# Patient Record
Sex: Male | Born: 1935 | Race: White | Hispanic: No | Marital: Married | Smoking: Former smoker
Health system: Southern US, Community
[De-identification: ages and names within clinical notes are randomized; demographics above are authoritative.]

## PROBLEM LIST (undated history)

## (undated) DIAGNOSIS — E785 Hyperlipidemia, unspecified: Secondary | ICD-10-CM

## (undated) DIAGNOSIS — N400 Enlarged prostate without lower urinary tract symptoms: Secondary | ICD-10-CM

## (undated) DIAGNOSIS — I10 Essential (primary) hypertension: Secondary | ICD-10-CM

## (undated) DIAGNOSIS — K219 Gastro-esophageal reflux disease without esophagitis: Secondary | ICD-10-CM

## (undated) DIAGNOSIS — N390 Urinary tract infection, site not specified: Secondary | ICD-10-CM

## (undated) DIAGNOSIS — R251 Tremor, unspecified: Secondary | ICD-10-CM

## (undated) DIAGNOSIS — I219 Acute myocardial infarction, unspecified: Secondary | ICD-10-CM

## (undated) DIAGNOSIS — J9600 Acute respiratory failure, unspecified whether with hypoxia or hypercapnia: Secondary | ICD-10-CM

## (undated) DIAGNOSIS — T7840XA Allergy, unspecified, initial encounter: Secondary | ICD-10-CM

## (undated) DIAGNOSIS — I2510 Atherosclerotic heart disease of native coronary artery without angina pectoris: Secondary | ICD-10-CM

## (undated) DIAGNOSIS — C4490 Unspecified malignant neoplasm of skin, unspecified: Secondary | ICD-10-CM

## (undated) HISTORY — DX: Essential (primary) hypertension: I10

## (undated) HISTORY — DX: Atherosclerotic heart disease of native coronary artery without angina pectoris: I25.10

## (undated) HISTORY — DX: Urinary tract infection, site not specified: N39.0

## (undated) HISTORY — DX: Hyperlipidemia, unspecified: E78.5

## (undated) HISTORY — DX: Unspecified malignant neoplasm of skin, unspecified: C44.90

## (undated) HISTORY — DX: Gastro-esophageal reflux disease without esophagitis: K21.9

## (undated) HISTORY — DX: Benign prostatic hyperplasia without lower urinary tract symptoms: N40.0

## (undated) HISTORY — PX: CATARACT EXTRACTION: SUR2

## (undated) HISTORY — DX: Acute myocardial infarction, unspecified: I21.9

## (undated) HISTORY — PX: TONSILLECTOMY: SUR1361

## (undated) HISTORY — DX: Tremor, unspecified: R25.1

## (undated) HISTORY — DX: Acute respiratory failure, unspecified whether with hypoxia or hypercapnia: J96.00

## (undated) HISTORY — DX: Allergy, unspecified, initial encounter: T78.40XA

---

## 2002-05-10 DIAGNOSIS — I219 Acute myocardial infarction, unspecified: Secondary | ICD-10-CM

## 2002-05-10 HISTORY — DX: Acute myocardial infarction, unspecified: I21.9

## 2003-04-10 HISTORY — PX: CARDIAC CATHETERIZATION: SHX172

## 2003-05-10 ENCOUNTER — Inpatient Hospital Stay (HOSPITAL_COMMUNITY): Admission: EM | Admit: 2003-05-10 | Discharge: 2003-05-28 | Payer: Self-pay | Admitting: Emergency Medicine

## 2003-05-23 ENCOUNTER — Encounter: Payer: Self-pay | Admitting: Cardiology

## 2003-06-24 ENCOUNTER — Encounter (HOSPITAL_COMMUNITY): Admission: RE | Admit: 2003-06-24 | Discharge: 2003-09-22 | Payer: Self-pay | Admitting: Cardiology

## 2004-03-19 ENCOUNTER — Ambulatory Visit: Payer: Self-pay | Admitting: Family Medicine

## 2004-05-14 ENCOUNTER — Ambulatory Visit: Payer: Self-pay | Admitting: Cardiology

## 2004-05-20 ENCOUNTER — Ambulatory Visit: Payer: Self-pay | Admitting: *Deleted

## 2004-11-24 ENCOUNTER — Ambulatory Visit: Payer: Self-pay | Admitting: Cardiology

## 2004-12-02 ENCOUNTER — Ambulatory Visit: Payer: Self-pay

## 2004-12-02 ENCOUNTER — Ambulatory Visit: Payer: Self-pay | Admitting: Cardiology

## 2004-12-17 ENCOUNTER — Ambulatory Visit: Payer: Self-pay | Admitting: Family Medicine

## 2005-01-12 ENCOUNTER — Ambulatory Visit: Payer: Self-pay | Admitting: Gastroenterology

## 2005-01-19 ENCOUNTER — Ambulatory Visit: Payer: Self-pay | Admitting: Family Medicine

## 2005-02-01 ENCOUNTER — Encounter (INDEPENDENT_AMBULATORY_CARE_PROVIDER_SITE_OTHER): Payer: Self-pay | Admitting: *Deleted

## 2005-02-01 ENCOUNTER — Ambulatory Visit: Payer: Self-pay | Admitting: Gastroenterology

## 2005-02-11 ENCOUNTER — Ambulatory Visit: Payer: Self-pay | Admitting: Gastroenterology

## 2005-02-17 ENCOUNTER — Ambulatory Visit: Payer: Self-pay | Admitting: Cardiology

## 2005-03-08 ENCOUNTER — Ambulatory Visit: Payer: Self-pay

## 2005-03-19 ENCOUNTER — Ambulatory Visit: Payer: Self-pay | Admitting: Cardiology

## 2005-03-24 ENCOUNTER — Ambulatory Visit: Payer: Self-pay | Admitting: Family Medicine

## 2005-07-06 ENCOUNTER — Ambulatory Visit: Payer: Self-pay | Admitting: Gastroenterology

## 2005-08-03 ENCOUNTER — Ambulatory Visit: Payer: Self-pay | Admitting: Gastroenterology

## 2005-08-03 ENCOUNTER — Ambulatory Visit (HOSPITAL_COMMUNITY): Admission: RE | Admit: 2005-08-03 | Discharge: 2005-08-03 | Payer: Self-pay | Admitting: Gastroenterology

## 2005-08-25 ENCOUNTER — Ambulatory Visit: Payer: Self-pay | Admitting: Cardiology

## 2005-08-27 ENCOUNTER — Ambulatory Visit: Payer: Self-pay | Admitting: Cardiology

## 2006-02-08 ENCOUNTER — Ambulatory Visit: Payer: Self-pay | Admitting: Cardiology

## 2006-03-17 ENCOUNTER — Ambulatory Visit: Payer: Self-pay | Admitting: Family Medicine

## 2006-04-20 ENCOUNTER — Ambulatory Visit: Payer: Self-pay | Admitting: Family Medicine

## 2006-04-20 LAB — CONVERTED CEMR LAB
ALT: 22 units/L (ref 0–40)
AST: 32 units/L (ref 0–37)
Alkaline Phosphatase: 75 units/L (ref 39–117)
BUN: 22 mg/dL (ref 6–23)
Chol/HDL Ratio, serum: 2.2
Cholesterol: 107 mg/dL (ref 0–200)
Creatinine, Ser: 1.5 mg/dL (ref 0.4–1.5)
Glucose, Bld: 97 mg/dL (ref 70–99)
HDL: 49.6 mg/dL (ref >39.0)
Hgb A1c MFr Bld: 6.2 % — ABNORMAL HIGH (ref 4.6–6.0)
LDL Cholesterol: 47 mg/dL (ref 0–99)
PSA: 0.65 ng/mL (ref 0.10–4.00)
Potassium: 4.9 meq/L (ref 3.5–5.1)
Sodium: 148 meq/L — ABNORMAL HIGH (ref 135–145)
TSH: 5.15 microintl units/mL (ref 0.35–5.50)
Triglyceride fasting, serum: 52 mg/dL (ref 0–149)
VLDL: 10 mg/dL (ref 0–40)

## 2006-04-21 ENCOUNTER — Ambulatory Visit: Payer: Self-pay | Admitting: Family Medicine

## 2006-08-24 ENCOUNTER — Ambulatory Visit: Payer: Self-pay | Admitting: Cardiology

## 2006-08-31 ENCOUNTER — Ambulatory Visit: Payer: Self-pay | Admitting: Cardiology

## 2006-08-31 LAB — CONVERTED CEMR LAB
ALT: 22 units/L (ref 0–40)
AST: 24 units/L (ref 0–37)
Albumin: 3.7 g/dL (ref 3.5–5.2)
Alkaline Phosphatase: 69 units/L (ref 39–117)
BUN: 23 mg/dL (ref 6–23)
Bilirubin, Direct: 0.1 mg/dL (ref 0.0–0.3)
CO2: 27 meq/L (ref 19–32)
Calcium: 9.1 mg/dL (ref 8.4–10.5)
Chloride: 110 meq/L (ref 96–112)
Cholesterol: 107 mg/dL (ref 0–200)
Creatinine, Ser: 1.3 mg/dL (ref 0.4–1.5)
GFR calc Af Amer: 70 mL/min
GFR calc non Af Amer: 58 mL/min
Glucose, Bld: 81 mg/dL (ref 70–99)
HDL: 47 mg/dL (ref >39.0)
LDL Cholesterol: 47 mg/dL (ref 0–99)
Potassium: 4.1 meq/L (ref 3.5–5.1)
Sodium: 143 meq/L (ref 135–145)
Total Bilirubin: 0.8 mg/dL (ref 0.3–1.2)
Total CHOL/HDL Ratio: 2.3
Total Protein: 6.6 g/dL (ref 6.0–8.3)
Triglycerides: 64 mg/dL (ref 0–149)
VLDL: 13 mg/dL (ref 0–40)

## 2006-10-26 ENCOUNTER — Ambulatory Visit: Payer: Self-pay | Admitting: Family Medicine

## 2007-02-17 ENCOUNTER — Ambulatory Visit: Payer: Self-pay | Admitting: Family Medicine

## 2007-05-17 ENCOUNTER — Ambulatory Visit: Payer: Self-pay | Admitting: Cardiology

## 2007-05-19 ENCOUNTER — Ambulatory Visit: Payer: Self-pay

## 2007-05-19 LAB — CONVERTED CEMR LAB
ALT: 18 units/L (ref 0–53)
AST: 21 units/L (ref 0–37)
Albumin: 3.7 g/dL (ref 3.5–5.2)
Alkaline Phosphatase: 69 units/L (ref 39–117)
BUN: 24 mg/dL — ABNORMAL HIGH (ref 6–23)
Bilirubin, Direct: 0.1 mg/dL (ref 0.0–0.3)
CO2: 25 meq/L (ref 19–32)
Calcium: 9.2 mg/dL (ref 8.4–10.5)
Chloride: 107 meq/L (ref 96–112)
Cholesterol: 107 mg/dL (ref 0–200)
Creatinine, Ser: 1.4 mg/dL (ref 0.4–1.5)
GFR calc Af Amer: 64 mL/min
GFR calc non Af Amer: 53 mL/min
Glucose, Bld: 95 mg/dL (ref 70–99)
HDL: 42.7 mg/dL (ref >39.0)
LDL Cholesterol: 51 mg/dL (ref 0–99)
Potassium: 3.6 meq/L (ref 3.5–5.1)
Sodium: 139 meq/L (ref 135–145)
Total Bilirubin: 1 mg/dL (ref 0.3–1.2)
Total CHOL/HDL Ratio: 2.5
Total Protein: 6.6 g/dL (ref 6.0–8.3)
Triglycerides: 67 mg/dL (ref 0–149)
VLDL: 13 mg/dL (ref 0–40)

## 2008-02-13 ENCOUNTER — Ambulatory Visit: Payer: Self-pay | Admitting: Family Medicine

## 2008-04-16 ENCOUNTER — Ambulatory Visit: Payer: Self-pay | Admitting: Family Medicine

## 2008-04-16 DIAGNOSIS — I2510 Atherosclerotic heart disease of native coronary artery without angina pectoris: Secondary | ICD-10-CM | POA: Insufficient documentation

## 2008-04-16 DIAGNOSIS — K219 Gastro-esophageal reflux disease without esophagitis: Secondary | ICD-10-CM

## 2008-04-16 DIAGNOSIS — I10 Essential (primary) hypertension: Secondary | ICD-10-CM

## 2008-04-16 DIAGNOSIS — E785 Hyperlipidemia, unspecified: Secondary | ICD-10-CM

## 2008-05-23 ENCOUNTER — Ambulatory Visit: Payer: Self-pay | Admitting: Cardiology

## 2008-05-23 LAB — CONVERTED CEMR LAB
BUN: 24 mg/dL — ABNORMAL HIGH (ref 6–23)
CO2: 26 meq/L (ref 19–32)
GFR calc Af Amer: 69 mL/min
Glucose, Bld: 94 mg/dL (ref 70–99)
Potassium: 3.7 meq/L (ref 3.5–5.1)
Sodium: 140 meq/L (ref 135–145)

## 2008-07-23 ENCOUNTER — Telehealth: Payer: Self-pay | Admitting: Family Medicine

## 2009-02-13 ENCOUNTER — Ambulatory Visit: Payer: Self-pay | Admitting: Family Medicine

## 2009-05-16 ENCOUNTER — Ambulatory Visit: Payer: Self-pay | Admitting: Family Medicine

## 2009-05-16 DIAGNOSIS — IMO0002 Reserved for concepts with insufficient information to code with codable children: Secondary | ICD-10-CM | POA: Insufficient documentation

## 2009-05-19 ENCOUNTER — Ambulatory Visit: Payer: Self-pay | Admitting: Family Medicine

## 2009-05-19 LAB — CONVERTED CEMR LAB
ALT: 16 units/L (ref 0–53)
AST: 23 units/L (ref 0–37)
Albumin: 3.9 g/dL (ref 3.5–5.2)
Alkaline Phosphatase: 71 units/L (ref 39–117)
Basophils Relative: 0.7 % (ref 0.0–3.0)
Bilirubin, Direct: 0.1 mg/dL (ref 0.0–0.3)
CO2: 24 meq/L (ref 19–32)
Calcium: 9.6 mg/dL (ref 8.4–10.5)
Creatinine, Ser: 1.4 mg/dL (ref 0.4–1.5)
Eosinophils Absolute: 0.1 10*3/uL (ref 0.0–0.7)
Eosinophils Relative: 1.4 % (ref 0.0–5.0)
GFR calc non Af Amer: 52.49 mL/min (ref >60)
HDL: 49.8 mg/dL (ref >39.00)
Hemoglobin: 13.7 g/dL (ref 13.0–17.0)
LDL Cholesterol: 53 mg/dL (ref 0–99)
Lymphocytes Relative: 24.1 % (ref 12.0–46.0)
Monocytes Relative: 8.5 % (ref 3.0–12.0)
Neutro Abs: 4.5 10*3/uL (ref 1.4–7.7)
Neutrophils Relative %: 65.3 % (ref 43.0–77.0)
RBC: 4.8 M/uL (ref 4.22–5.81)
Sodium: 142 meq/L (ref 135–145)
Total CHOL/HDL Ratio: 2
Total Protein: 7.6 g/dL (ref 6.0–8.3)
Triglycerides: 64 mg/dL (ref 0.0–149.0)
WBC: 6.8 10*3/uL (ref 4.5–10.5)

## 2009-05-21 ENCOUNTER — Ambulatory Visit: Payer: Self-pay | Admitting: Family Medicine

## 2009-05-26 ENCOUNTER — Ambulatory Visit: Payer: Self-pay | Admitting: Family Medicine

## 2009-06-05 ENCOUNTER — Ambulatory Visit: Payer: Self-pay | Admitting: Family Medicine

## 2009-06-19 DIAGNOSIS — J9600 Acute respiratory failure, unspecified whether with hypoxia or hypercapnia: Secondary | ICD-10-CM

## 2009-06-23 ENCOUNTER — Ambulatory Visit: Payer: Self-pay | Admitting: Cardiology

## 2009-08-18 ENCOUNTER — Telehealth: Payer: Self-pay | Admitting: Family Medicine

## 2009-10-09 ENCOUNTER — Emergency Department (HOSPITAL_COMMUNITY): Admission: EM | Admit: 2009-10-09 | Discharge: 2009-10-09 | Payer: Self-pay | Admitting: Emergency Medicine

## 2009-10-10 ENCOUNTER — Emergency Department (HOSPITAL_COMMUNITY)
Admission: EM | Admit: 2009-10-10 | Discharge: 2009-10-10 | Payer: Self-pay | Source: Home / Self Care | Admitting: Emergency Medicine

## 2009-11-07 ENCOUNTER — Ambulatory Visit: Payer: Self-pay | Admitting: Internal Medicine

## 2009-11-07 DIAGNOSIS — N390 Urinary tract infection, site not specified: Secondary | ICD-10-CM | POA: Insufficient documentation

## 2010-02-12 ENCOUNTER — Ambulatory Visit: Payer: Self-pay | Admitting: Family Medicine

## 2010-06-07 LAB — CONVERTED CEMR LAB
ALT: 19 units/L (ref 0–53)
AST: 25 units/L (ref 0–37)
Albumin: 4.1 g/dL (ref 3.5–5.2)
Alkaline Phosphatase: 72 units/L (ref 39–117)
Basophils Absolute: 0 10*3/uL (ref 0.0–0.1)
Basophils Relative: 0.3 % (ref 0.0–3.0)
Hemoglobin: 14.8 g/dL (ref 13.0–17.0)
Hgb A1c MFr Bld: 6.3 % — ABNORMAL HIGH (ref 4.6–6.0)
LDL Cholesterol: 68 mg/dL (ref 0–99)
Lymphocytes Relative: 25.8 % (ref 12.0–46.0)
Monocytes Relative: 7.3 % (ref 3.0–12.0)
Neutro Abs: 4.7 10*3/uL (ref 1.4–7.7)
Neutrophils Relative %: 64.7 % (ref 43.0–77.0)
RBC: 5.02 M/uL (ref 4.22–5.81)
Total CHOL/HDL Ratio: 2.6
VLDL: 15 mg/dL (ref 0–40)
WBC: 7.2 10*3/uL (ref 4.5–10.5)

## 2010-06-09 NOTE — Assessment & Plan Note (Signed)
Summary: ROV/MM/wife rescd per pt//ccm   Reason for Visit follow up with scalp  History of Present Illness: Mr. Heist comes in today for evaluation of cellulitis of the scalp.  It is markedly improved.  His healing.  No pus.  Allergies: 1)  ! Sulfa  Past History:  Past medical, surgical, family and social histories (including risk factors) reviewed for relevance to current acute and chronic problems.  Past Medical History: Reviewed history from 04/16/2008 and no changes required. Coronary artery disease Hyperlipidemia Hypertension GERD  Past Surgical History: Reviewed history from 04/16/2008 and no changes required. cardiac catheter December 2004 stents Tonsillectomy  Family History: Reviewed history from 04/16/2008 and no changes required. Family History of CAD Male 1st degree relative <60 Family History Hypertension  Social History: Reviewed history from 04/16/2008 and no changes required. Retired Married Former Smoker Alcohol use-no Drug use-no Regular exercise-yes  Review of Systems      See HPI  Physical Exam  General:  Well-developed,well-nourished,in no acute distress; alert,appropriate and cooperative throughout examination Skin:  the scalp is healing well.  There is still some erythema   Impression & Recommendations:  Problem # 1:  ABRASION (ICD-919.0) Assessment Improved  Complete Medication List: 1)  Plavix 75 Mg Tabs (Clopidogrel bisulfate) .... Take one tab once daily 2)  Prilosec Otc 20 Mg Tbec (Omeprazole magnesium) .... Take once daily 3)  Coreg 12.5 Mg Tabs (Carvedilol) .... Take one tab two times a day 4)  Lisinopril 2.5 Mg Tabs (Lisinopril) .... Take one  tab two times a day 5)  Lipitor 80 Mg Tabs (Atorvastatin calcium) .... Take one tab once daily 6)  Adult Aspirin Ec Low Strength 81 Mg Tbec (Aspirin) .... Once daily 7)  Nitrostat 0.4 Mg Subl (Nitroglycerin) .... As needed 8)  Colace 50 Mg Caps (Docusate sodium) .... As  needed 9)  Keflex 500 Mg Caps (Cephalexin) .... 2 by mouth two times a day 10)  Doxycycline Hyclate 100 Mg Caps (Doxycycline hyclate) .... Take 1 tablet by mouth two times a day 11)  Silvadene 1 % Crea (Silver sulfadiazine) .... Apply daily  Other Orders: Prescription Created Electronically [phone])  Patient Instructions: 1)  leave the area I near her scalp, open to the air. 2)  Take one Keflex, and one doxycycline daily, x 3 weeks.  Return p.r.n. Prescriptions: DOXYCYCLINE HYCLATE 100 MG CAPS (DOXYCYCLINE HYCLATE) Take 1 tablet by mouth two times a day  #21 x 1   Entered and Authorized by:   Roderick Pee MD   Signed by:   Roderick Pee MD on 06/05/2009   Method used:   Electronically to        [id number] (retail)       [address]       [place], [place]  [address]       Ph: [phone] or [phone]       Fax: [phone]   RxID:   [id number] KEFLEX 500 MG CAPS (CEPHALEXIN) 2 by mouth two times a day  #21 x 1   Entered and Authorized by:   Roderick Pee MD   Signed by:   Roderick Pee MD on 06/05/2009   Method used:   Electronically to        [id number] (retail)       [address]       [place], [place]  [address]       Ph: [phone] or [phone]  Fax: [phone]   RxID:   [id number]

## 2010-06-09 NOTE — Assessment & Plan Note (Signed)
Summary: 5 day rov/njr   History of Present Illness: Gram is a 77 year old male, who comes in today for reevaluation of cellulitis of the scalp.  He had a 6-inch area of erythema with secondary infection.  Is now about 3 inches in diameter.  Its healing, but not completely well.  He continues to take Keflex and doxycycline.  He's allergic to Septra.  In the past.  He had a slightly elevated blood sugar when he was hospitalized on IV fluids.  Will check blood sugar today.    Allergies: 1)  ! Sulfa  Past History:  Past medical, surgical, family and social histories (including risk factors) reviewed for relevance to current acute and chronic problems.  Past Medical History: Reviewed history from 04/16/2008 and no changes required. Coronary artery disease Hyperlipidemia Hypertension GERD  Past Surgical History: Reviewed history from 04/16/2008 and no changes required. cardiac catheter December 2004 stents Tonsillectomy  Family History: Reviewed history from 04/16/2008 and no changes required. Family History of CAD Male 1st degree relative <60 Family History Hypertension  Social History: Reviewed history from 04/16/2008 and no changes required. Retired Married Former Smoker Alcohol use-no Drug use-no Regular exercise-yes  Review of Systems      See HPI  Physical Exam  General:  Well-developed,well-nourished,in no acute distress; alert,appropriate and cooperative throughout examination Skin:  the area if erythema is about 50% smaller than it was a week ago.   Impression & Recommendations:  Problem # 1:  ABRASION (ICD-919.0) Assessment Improved  Orders: Glucose, (CBG) ([address])  Complete Medication List: 1)  Plavix 75 Mg Tabs (Clopidogrel bisulfate) .... Take one tab once daily 2)  Prilosec Otc 20 Mg Tbec (Omeprazole magnesium) .... Take once daily 3)  Coreg 12.5 Mg Tabs (Carvedilol) .... Take one tab two times a day 4)  Lisinopril 2.5 Mg Tabs (Lisinopril)  .... Take one  tab two times a day 5)  Lipitor 80 Mg Tabs (Atorvastatin calcium) .... Take one tab once daily 6)  Adult Aspirin Ec Low Strength 81 Mg Tbec (Aspirin) .... Once daily 7)  Nitrostat 0.4 Mg Subl (Nitroglycerin) .... As needed 8)  Colace 50 Mg Caps (Docusate sodium) .... As needed 9)  Keflex 500 Mg Caps (Cephalexin) .... 2 by mouth two times a day 10)  Doxycycline Hyclate 100 Mg Caps (Doxycycline hyclate) .... Take 1 tablet by mouth two times a day 11)  Silvadene 1 % Crea (Silver sulfadiazine) .... Apply daily  Patient Instructions: 1)  continue current treatment program return in 10 days for follow

## 2010-06-09 NOTE — Assessment & Plan Note (Signed)
Summary: f1y/jss    CC:  yearly visit.  History of Present Illness: [name] [name] is a pleasant gentleman who has a history of coronary artery disease, ischemic cardiomyopathy, and hyperlipidemia.  He had a myocardial infarction in December 2004 complicated by ventricular fibrillation arrest.  He had PCI of his LAD at that time.  His most recent Myoview on May 19, 2007, showed an ejection fraction of 24%. There was a prior anterior, septal, and apical infarct, but there was no ischemia.  He did have 4-beat run of ventricular tachycardia.  He has declined ICD previously. Previous carotid Dopplers in July of 2006 showed 0-39% bilateral stenosis. Abdominal ultrasound at that time showed no aneurysm. I last saw him in January of 2010. Since then the patient denies any dyspnea on exertion, orthopnea, PND, pedal edema, palpitations, syncope or chest pain.   Current Medications (verified): 1)  Plavix 75 Mg Tabs (Clopidogrel Bisulfate) .... Take One Tab Once Daily 2)  Prilosec Otc 20 Mg Tbec (Omeprazole Magnesium) .... Take Once Daily 3)  Coreg 12.5 Mg Tabs (Carvedilol) .... Take One Tab Two Times A Day 4)  Lisinopril 2.5 Mg Tabs (Lisinopril) .... Take One  Tab Two Times A Day 5)  Lipitor 80 Mg Tabs (Atorvastatin Calcium) .... Take One Tab Once Daily 6)  Adult Aspirin Ec Low Strength 81 Mg Tbec (Aspirin) .... Once Daily 7)  Nitrostat 0.4 Mg Subl (Nitroglycerin) .... As Needed 8)  Keflex 500 Mg Caps (Cephalexin) .... 2 By Mouth Two Times A Day 9)  Doxycycline Hyclate 100 Mg Caps (Doxycycline Hyclate) .... Take 1 Tablet By Mouth Two Times A Day  Allergies: 1)  ! Sulfa  Past History:  Past Medical History: Current Problems:  HYPERTENSION (ICD-401.9) HYPERLIPIDEMIA (ICD-272.4) CORONARY ARTERY DISEASE (ICD-414.00) CARDIOMYOPATHY (ICD-425.4) GERD (ICD-530.81)  ventricular fibrillation arrest in the emergency  room.  Past Surgical History: Reviewed history from 04/16/2008 and no changes  required. cardiac catheter December 2004 stents Tonsillectomy  Social History: Reviewed history from 04/16/2008 and no changes required. Retired Married Former Smoker Alcohol use-no Drug use-no Regular exercise-yes  Review of Systems       Recent cellulitis on the scalp but no fevers or chills, productive cough, hemoptysis, dysphasia, odynophagia, melena, hematochezia, dysuria, hematuria, rash, seizure activity, orthopnea, PND, pedal edema, claudication. Remaining systems are negative.   Vital Signs:  Patient profile:   77 year old male Height:      68 inches Weight:      154 pounds BMI:     23.50 Pulse rate:   76 / minute Pulse rhythm:   regular Resp:     12 per minute BP sitting:   130 / 82  (left arm)  Vitals Entered By: Kem Parkinson (June 23, 2009 9:10 AM)  Physical Exam  General:  Well-developed well-nourished in no acute distress.  Skin is warm and dry.  HEENT is significant for healing cellulitis on the scalp. Neck is supple. No thyromegaly.  Chest is clear to auscultation with normal expansion.  Cardiovascular exam is regular rate and rhythm.  Abdominal exam nontender or distended. No masses palpated. Extremities show no edema. neuro grossly intact    EKG  Procedure date:  06/23/2009  Findings:      Sinus rhythm at a rate of 76. Axis normal. Prior septal infarct. Nonspecific ST changes.  Impression & Recommendations:  Problem # 1:  CORONARY ARTERY DISEASE (ICD-414.00) Continue aspirin, Plavix, beta blocker, ACE inhibitor and statin. Continue diet and exercise. He does not  smoke. His updated medication list for this problem includes:    Plavix 75 Mg Tabs (Clopidogrel bisulfate) .Marland Kitchen... Take one tab once daily    Coreg 12.5 Mg Tabs (Carvedilol) .Marland Kitchen... Take one tab two times a day    Lisinopril 2.5 Mg Tabs (Lisinopril) .Marland Kitchen... Take one  tab two times a day    Adult Aspirin Ec Low Strength 81 Mg Tbec (Aspirin) ..... Once daily    Nitrostat 0.4 Mg  Subl (Nitroglycerin) .Marland Kitchen... As needed  Problem # 2:  CARDIOMYOPATHY (ICD-425.4) Continue present medications. He has declined ICD and understands the risk of sudden death. His updated medication list for this problem includes:    Plavix 75 Mg Tabs (Clopidogrel bisulfate) .Marland Kitchen... Take one tab once daily    Coreg 12.5 Mg Tabs (Carvedilol) .Marland Kitchen... Take one tab two times a day    Lisinopril 2.5 Mg Tabs (Lisinopril) .Marland Kitchen... Take one  tab two times a day    Adult Aspirin Ec Low Strength 81 Mg Tbec (Aspirin) ..... Once daily    Nitrostat 0.4 Mg Subl (Nitroglycerin) .Marland Kitchen... As needed  Problem # 3:  HYPERTENSION (ICD-401.9) Continue present medications. Blood pressure controlled. Recent potassium normal. His updated medication list for this problem includes:    Coreg 12.5 Mg Tabs (Carvedilol) .Marland Kitchen... Take one tab two times a day    Lisinopril 2.5 Mg Tabs (Lisinopril) .Marland Kitchen... Take one  tab two times a day    Adult Aspirin Ec Low Strength 81 Mg Tbec (Aspirin) ..... Once daily  Problem # 4:  HYPERLIPIDEMIA (ICD-272.4) Continue statin. Lipids and liver in January of 2011 outstanding. His updated medication list for this problem includes:    Lipitor 80 Mg Tabs (Atorvastatin calcium) .Marland Kitchen... Take one tab once daily  Problem # 5:  GERD (ICD-530.81) Discontinue Prilosec and treat with Pepcid or Zantac. His updated medication list for this problem includes:    Prilosec Otc 20 Mg Tbec (Omeprazole magnesium) .Marland Kitchen... Take once daily  Patient Instructions: 1)  Your physician recommends that you schedule a follow-up appointment in:ONE YEAR

## 2010-06-09 NOTE — Assessment & Plan Note (Signed)
Summary: UTI? // RS   Vital Signs:  Patient profile:   77 year old male Weight:      154 pounds Temp:     97.7 degrees F oral BP sitting:   140 / 80 Cuff size:   regular  Vitals Entered By: Kathrynn Speed CMA (November 07, 2009 2:32 PM) CC: UTI / buring & pain for one month / src   CC:  UTI / buring & pain for one month / src.  History of Present Illness: 77 year old patient who is seen today with a two week history of burning, dysuria, and frequency;  for the past several days, he has noted cloudy urine.  there is been no fever or chills.  He has been quite frustrated due to a scalp dermatitis, apparently secondary to Pseudomonas.  He has treated hypertension and coronary artery disease, which have been stable  Current Medications (verified): 1)  Plavix 75 Mg Tabs (Clopidogrel Bisulfate) .... Take One Tab Once Daily 2)  Coreg 12.5 Mg Tabs (Carvedilol) .... Take One Tab Two Times A Day 3)  Lisinopril 2.5 Mg Tabs (Lisinopril) .... Take One  Tab Two Times A Day 4)  Lipitor 80 Mg Tabs (Atorvastatin Calcium) .... Take One Tab Once Daily 5)  Adult Aspirin Ec Low Strength 81 Mg Tbec (Aspirin) .... Once Daily 6)  Nitrostat 0.4 Mg Subl (Nitroglycerin) .... As Needed 7)  Keflex 500 Mg Caps (Cephalexin) .... 2 By Mouth Two Times A Day 8)  Doxycycline Hyclate 100 Mg Caps (Doxycycline Hyclate) .... Take 1 Tablet By Mouth Two Times A Day 9)  Zantac 25 Mg/ml Soln (Ranitidine Hcl) .... Once A Day  Allergies (verified): 1)  ! Sulfa  Past History:  Past Medical History: Reviewed history from 06/23/2009 and no changes required. Current Problems:  HYPERTENSION (ICD-401.9) HYPERLIPIDEMIA (ICD-272.4) CORONARY ARTERY DISEASE (ICD-414.00) CARDIOMYOPATHY (ICD-425.4) GERD (ICD-530.81)  ventricular fibrillation arrest in the emergency  room.  Review of Systems       The patient complains of suspicious skin lesions.  The patient denies anorexia, fever, weight loss, weight gain, vision loss,  decreased hearing, hoarseness, chest pain, syncope, dyspnea on exertion, peripheral edema, prolonged cough, headaches, hemoptysis, abdominal pain, melena, hematochezia, severe indigestion/heartburn, hematuria, incontinence, genital sores, muscle weakness, transient blindness, difficulty walking, depression, unusual weight change, abnormal bleeding, enlarged lymph nodes, angioedema, breast masses, and testicular masses.    Physical Exam  General:  Well-developed,well-nourished,in no acute distress; alert,appropriate and cooperative throughout examination Neck:  No deformities, masses, or tenderness noted. Lungs:  Normal respiratory effort, chest expands symmetrically. Lungs are clear to auscultation, no crackles or wheezes. Heart:  Normal rate and regular rhythm. S1 and S2 normal without gallop, murmur, click, rub or other extra sounds.   Impression & Recommendations:  Problem # 1:  UTI (ICD-599.0)  His updated medication list for this problem includes:    Keflex 500 Mg Caps (Cephalexin) .Marland Kitchen... 2 by mouth two times a day    Doxycycline Hyclate 100 Mg Caps (Doxycycline hyclate) .Marland Kitchen... Take 1 tablet by mouth two times a day    Ciprofloxacin Hcl 500 Mg Tabs (Ciprofloxacin hcl) ..... One twice daily  Orders: UA Dipstick w/o Micro (manual) ([address])  Problem # 2:  HYPERTENSION (ICD-401.9)  His updated medication list for this problem includes:    Coreg 12.5 Mg Tabs (Carvedilol) .Marland Kitchen... Take one tab two times a day    Lisinopril 2.5 Mg Tabs (Lisinopril) .Marland Kitchen... Take one  tab two times a day  Complete Medication  List: 1)  Plavix 75 Mg Tabs (Clopidogrel bisulfate) .... Take one tab once daily 2)  Coreg 12.5 Mg Tabs (Carvedilol) .... Take one tab two times a day 3)  Lisinopril 2.5 Mg Tabs (Lisinopril) .... Take one  tab two times a day 4)  Lipitor 80 Mg Tabs (Atorvastatin calcium) .... Take one tab once daily 5)  Adult Aspirin Ec Low Strength 81 Mg Tbec (Aspirin) .... Once daily 6)  Nitrostat 0.4 Mg  Subl (Nitroglycerin) .... As needed 7)  Keflex 500 Mg Caps (Cephalexin) .... 2 by mouth two times a day 8)  Doxycycline Hyclate 100 Mg Caps (Doxycycline hyclate) .... Take 1 tablet by mouth two times a day 9)  Zantac 25 Mg/ml Soln (Ranitidine hcl) .... Once a day 10)  Ciprofloxacin Hcl 500 Mg Tabs (Ciprofloxacin hcl) .... One twice daily  Patient Instructions: 1)  Take your antibiotic as prescribed until ALL of it is gone, but stop if you develop a rash or swelling and contact our office as soon as possible. Prescriptions: CIPROFLOXACIN HCL 500 MG TABS (CIPROFLOXACIN HCL) one twice daily  #20 x 0   Entered and Authorized by:   Gordy Savers  MD   Signed by:   Gordy Savers  MD on 11/07/2009   Method used:   Print then Give to Patient   RxID:   [id number] CIPROFLOXACIN HCL 500 MG TABS (CIPROFLOXACIN HCL) one twice daily  #20 x 0   Entered and Authorized by:   Gordy Savers  MD   Signed by:   Gordy Savers  MD on 11/07/2009   Method used:   Electronically to        [facility]* (retail)       [address], [address]       [place], [place]  [address]       Ph: [phone]       Fax: [phone]   RxID:   [id number]   Appended Document: UTI? // RS     Allergies: 1)  ! Sulfa   Complete Medication List: 1)  Plavix 75 Mg Tabs (Clopidogrel bisulfate) .... Take one tab once daily 2)  Coreg 12.5 Mg Tabs (Carvedilol) .... Take one tab two times a day 3)  Lisinopril 2.5 Mg Tabs (Lisinopril) .... Take one  tab two times a day 4)  Lipitor 80 Mg Tabs (Atorvastatin calcium) .... Take one tab once daily 5)  Adult Aspirin Ec Low Strength 81 Mg Tbec (Aspirin) .... Once daily 6)  Nitrostat 0.4 Mg Subl (Nitroglycerin) .... As needed 7)  Keflex 500 Mg Caps (Cephalexin) .... 2 by mouth two times a day 8)  Doxycycline Hyclate 100 Mg Caps (Doxycycline hyclate) .... Take 1 tablet by mouth two times a day 9)  Zantac 25 Mg/ml Soln (Ranitidine hcl) .... Once a  day 10)  Ciprofloxacin Hcl 500 Mg Tabs (Ciprofloxacin hcl) .... One twice daily   Laboratory Results   Urine Tests  Date/Time Received: November 07, 2009 3:19 PM  Date/Time Reported: November 07, 2009 3:19 PM   Routine Urinalysis   Color: yellow Appearance: Cloudy Glucose: negative   (Normal Range: Negative) Bilirubin: negative   (Normal Range: Negative) Ketone: negative   (Normal Range: Negative) Spec. Gravity: 1.020   (Normal Range: 1.003-1.035) Blood: trace-intact   (Normal Range: Negative) pH: 6.0   (Normal Range: 5.0-8.0) Protein: trace   (Normal Range: Negative) Urobilinogen: 0.2   (Normal Range: 0-1) Nitrite: negative   (Normal Range: Negative)  Leukocyte Esterace: moderate   (Normal Range: Negative)

## 2010-06-09 NOTE — Progress Notes (Signed)
Summary: wants referral  Phone Note Call from Patient Call back at Home Phone [phone]   Caller: spouse---live call Summary of Call: was seen for a treatment in January. He is no better. Would like to see a specialist. Initial call taken by: Warnell Forester,  August 18, 2009 1:28 PM  Follow-up for Phone Call        Fleet Contras please call if this is the scalp.  Saline is referred to Dr. Para Skeans Follow-up by: Roderick Pee MD,  August 18, 2009 1:53 PM

## 2010-06-09 NOTE — Assessment & Plan Note (Signed)
Summary: 3 DAY ROV/NJR   Reason for Visit follow up with head  History of Present Illness: [name] [name] is a 77 year old, married male, nonsmoker, who comes in today for reevaluation of a scalp cellulitis.  He taking the Keflex, two tabs b.i.d.  It still oozing and draining pus.  No fever  Allergies: 1)  ! Sulfa  Past History:  Past medical, surgical, family and social histories (including risk factors) reviewed for relevance to current acute and chronic problems.  Past Medical History: Reviewed history from 04/16/2008 and no changes required. Coronary artery disease Hyperlipidemia Hypertension GERD  Past Surgical History: Reviewed history from 04/16/2008 and no changes required. cardiac catheter December 2004 stents Tonsillectomy  Family History: Reviewed history from 04/16/2008 and no changes required. Family History of CAD Male 1st degree relative <60 Family History Hypertension  Social History: Reviewed history from 04/16/2008 and no changes required. Retired Married Former Smoker Alcohol use-no Drug use-no Regular exercise-yes  Review of Systems      See HPI  Physical Exam  General:  Well-developed,well-nourished,in no acute distress; alert,appropriate and cooperative throughout examination Skin:  the area was cleaned.  It still red, but less so.   Impression & Recommendations:  Problem # 1:  ABRASION (ICD-919.0) Assessment Improved  Complete Medication List: 1)  Plavix 75 Mg Tabs (Clopidogrel bisulfate) .... Take one tab once daily 2)  Prilosec Otc 20 Mg Tbec (Omeprazole magnesium) .... Take once daily 3)  Coreg 12.5 Mg Tabs (Carvedilol) .... Take one tab two times a day 4)  Lisinopril 2.5 Mg Tabs (Lisinopril) .... Take one  tab two times a day 5)  Lipitor 80 Mg Tabs (Atorvastatin calcium) .... Take one tab once daily 6)  Adult Aspirin Ec Low Strength 81 Mg Tbec (Aspirin) .... Once daily 7)  Nitrostat 0.4 Mg Subl (Nitroglycerin) .... As needed 8)   Colace 50 Mg Caps (Docusate sodium) .... As needed 9)  Keflex 500 Mg Caps (Cephalexin) .... 2 by mouth two times a day 10)  Doxycycline Hyclate 100 Mg Caps (Doxycycline hyclate) .... Take 1 tablet by mouth two times a day  Patient Instructions: 1)  continue the Keflex, two tabs b.i.d., add doxycycline 100 mg b.i.d. return Wednesday for follow-up.  Keep the dressing clean and dry Prescriptions: DOXYCYCLINE HYCLATE 100 MG CAPS (DOXYCYCLINE HYCLATE) Take 1 tablet by mouth two times a day  #40 x 0   Entered and Authorized by:   Roderick Pee MD   Signed by:   Roderick Pee MD on 05/19/2009   Method used:   Print then Give to Patient   RxID:   [id number]

## 2010-06-09 NOTE — Assessment & Plan Note (Signed)
Summary: flu shot/njr   Nurse Visit   Allergies: 1)  ! Sulfa  Orders Added: 1)  Flu Vaccine 65yrs + MEDICARE PATIENTS [Q2039] 2)  Administration Flu vaccine - MCR [G0008]  Flu Vaccine Consent Questions     Do you have a history of severe allergic reactions to this vaccine? no    Any prior history of allergic reactions to egg and/or gelatin? no    Do you have a sensitivity to the preservative Thimersol? no    Do you have a past history of Guillan-Barre Syndrome? no    Do you currently have an acute febrile illness? no    Have you ever had a severe reaction to latex? no    Vaccine information given and explained to patient? yes    Are you currently pregnant? no    Lot Number:[id number]   Exp Date:11/07/2010   Site Given  Left Deltoid IM Romualdo Bolk, CMA (AAMA)  February 12, 2010 1:38 PM

## 2010-06-09 NOTE — Assessment & Plan Note (Signed)
Summary: emp/pt fasting/cjr   Vital Signs:  Patient profile:   77 year old male Height:      68.25 inches Weight:      154 pounds BMI:     23.33 Temp:     98.6 degrees F oral BP sitting:   140 / 90  (left arm) Cuff size:   regular  Vitals Entered By: Kern Reap CMA Duncan Dull) (May 16, 2009 9:26 AM)  Reason for Visit cpx  History of Present Illness: Donaldo is a 77 year old, married male, nonsmoker, who comes in today for evaluation of multiple problems.  He is a history of underlying coronary artery disease.  He had a heart attack in 2004.  He's done well since that time has had no chest pain.  He sees his cardiologist, Dr. Jens Som, yearly.  He is on Plavix 75 mg daily, one aspirin tablet daily.  For hypertension.  He is on Coreg  12.5 mg b.i.d., lisinopril 2.5 mg b.i.d....BP 140/90.  He also takes Lipitor 80 mg nightly for hyperlipidemia.  Will check lipid panel today.  He also takes over-the-counter Prilosec 20 mg daily.  He's heard about the interaction of Plavix and Prilosec.  Asked him to discuss that with his cardiologist when he sees him next month.  A new problem is a scabbing on his scalp.  He states it started about two weeks ago.  No history of trauma.  He has a diffuse scab with oozing over the top of his scalp.  He gets routine eye care.  Dental care.  Colonoscopy done in GI by Dr. Arlyce Dice.  Normal except for polyps.  Tetanus 2004, flu, 2010, Pneumovax 2009, shingles 2007.  Allergies: 1)  ! Sulfa  Past History:  Past medical, surgical, family and social histories (including risk factors) reviewed, and no changes noted (except as noted below).  Past Medical History: Reviewed history from 04/16/2008 and no changes required. Coronary artery disease Hyperlipidemia Hypertension GERD  Past Surgical History: Reviewed history from 04/16/2008 and no changes required. cardiac catheter December 2004 stents Tonsillectomy  Family History: Reviewed history from  04/16/2008 and no changes required. Family History of CAD Male 1st degree relative <60 Family History Hypertension  Social History: Reviewed history from 04/16/2008 and no changes required. Retired Married Former Smoker Alcohol use-no Drug use-no Regular exercise-yes  Review of Systems      See HPI  Physical Exam  General:  Well-developed,well-nourished,in no acute distress; alert,appropriate and cooperative throughout examination Head:  Normocephalic and atraumatic without obvious abnormalities. No apparent alopecia or balding. Eyes:  No corneal or conjunctival inflammation noted. EOMI. Perrla. Funduscopic exam benign, without hemorrhages, exudates or papilledema. Vision grossly normal. Ears:  External ear exam shows no significant lesions or deformities.  Otoscopic examination reveals clear canals, tympanic membranes are intact bilaterally without bulging, retraction, inflammation or discharge. Hearing is grossly normal bilaterally. Nose:  External nasal examination shows no deformity or inflammation. Nasal mucosa are pink and moist without lesions or exudates. Mouth:  Oral mucosa and oropharynx without lesions or exudates.  Teeth in good repair. Neck:  No deformities, masses, or tenderness noted. Chest Wall:  No deformities, masses, tenderness or gynecomastia noted. Breasts:  No masses or gynecomastia noted Lungs:  Normal respiratory effort, chest expands symmetrically. Lungs are clear to auscultation, no crackles or wheezes. Heart:  Normal rate and regular rhythm. S1 and S2 normal without gallop, murmur, click, rub or other extra sounds. Abdomen:  Bowel sounds positive,abdomen soft and non-tender without masses, organomegaly or  hernias noted. Rectal:  No external abnormalities noted. Normal sphincter tone. No rectal masses or tenderness. Genitalia:  Testes bilaterally descended without nodularity, tenderness or masses. No scrotal masses or lesions. No penis lesions or urethral  discharge. Prostate:  Prostate gland firm and smooth, no enlargement, nodularity, tenderness, mass, asymmetry or induration. Msk:  No deformity or scoliosis noted of thoracic or lumbar spine.   Pulses:  R and L carotid,radial,femoral,dorsalis pedis and posterior tibial pulses are full and equal bilaterally Extremities:  No clubbing, cyanosis, edema, or deformity noted with normal full range of motion of all joints.   Neurologic:  No cranial nerve deficits noted. Station and gait are normal. Plantar reflexes are down-going bilaterally. DTRs are symmetrical throughout. Sensory, motor and coordinative functions appear intact. Skin:  there is an 8-inch by 6 since scab over the top of his scalp.......the area was cleaned with peroxide.  The scab was debrided.  There is underlying erythema.  The area was dressed with Silvadene and he will be started on antibiotics.           Cervical Nodes:  No lymphadenopathy noted Axillary Nodes:  No palpable lymphadenopathy Inguinal Nodes:  No significant adenopathy Psych:  Cognition and judgment appear intact. Alert and cooperative with normal attention span and concentration. No apparent delusions, illusions, hallucinations   Impression & Recommendations:  Problem # 1:  GERD (ICD-530.81) Assessment Improved  His updated medication list for this problem includes:    Prilosec Otc 20 Mg Tbec (Omeprazole magnesium) .Marland Kitchen... Take once daily  Orders: Venipuncture (14782) Prescription Created Electronically [id number]) TLB-Lipid Panel (80061-LIPID) TLB-BMP (Basic Metabolic Panel-BMET) (80048-METABOL) TLB-CBC Platelet - w/Differential (85025-CBCD) TLB-Hepatic/Liver Function Pnl (80076-HEPATIC) TLB-TSH (Thyroid Stimulating Hormone) (84443-TSH) TLB-PSA (Prostate Specific Antigen) (84153-PSA)  Problem # 2:  HYPERTENSION (ICD-401.9) Assessment: Improved  His updated medication list for this problem includes:    Coreg 12.5 Mg Tabs (Carvedilol) .Marland Kitchen... Take one  tab two times a day    Lisinopril 2.5 Mg Tabs (Lisinopril) .Marland Kitchen... Take one  tab two times a day  Orders: Venipuncture (30865) Prescription Created Electronically [phone]) UA Dipstick w/o Micro (automated)  (81003) TLB-Lipid Panel (80061-LIPID) TLB-BMP (Basic Metabolic Panel-BMET) (80048-METABOL) TLB-CBC Platelet - w/Differential (85025-CBCD) TLB-Hepatic/Liver Function Pnl (80076-HEPATIC) TLB-TSH (Thyroid Stimulating Hormone) (84443-TSH) TLB-PSA (Prostate Specific Antigen) (84153-PSA)  Problem # 3:  HYPERLIPIDEMIA (ICD-272.4) Assessment: Improved  His updated medication list for this problem includes:    Lipitor 80 Mg Tabs (Atorvastatin calcium) .Marland Kitchen... Take one tab once daily  Orders: Venipuncture (62952) Prescription Created Electronically [phone]) TLB-Lipid Panel (80061-LIPID) TLB-BMP (Basic Metabolic Panel-BMET) (80048-METABOL) TLB-CBC Platelet - w/Differential (85025-CBCD) TLB-Hepatic/Liver Function Pnl (80076-HEPATIC) TLB-TSH (Thyroid Stimulating Hormone) (84443-TSH) TLB-PSA (Prostate Specific Antigen) (84153-PSA)  Problem # 4:  CORONARY ARTERY DISEASE (ICD-414.00) Assessment: Unchanged  His updated medication list for this problem includes:    Plavix 75 Mg Tabs (Clopidogrel bisulfate) .Marland Kitchen... Take one tab once daily    Coreg 12.5 Mg Tabs (Carvedilol) .Marland Kitchen... Take one tab two times a day    Lisinopril 2.5 Mg Tabs (Lisinopril) .Marland Kitchen... Take one  tab two times a day    Adult Aspirin Ec Low Strength 81 Mg Tbec (Aspirin) ..... Once daily    Nitrostat 0.4 Mg Subl (Nitroglycerin) .Marland Kitchen... As needed  Orders: Venipuncture (44010) Prescription Created Electronically [phone]) TLB-Lipid Panel (80061-LIPID) TLB-BMP (Basic Metabolic Panel-BMET) (80048-METABOL) TLB-CBC Platelet - w/Differential (85025-CBCD) TLB-Hepatic/Liver Function Pnl (80076-HEPATIC) TLB-TSH (Thyroid Stimulating Hormone) (84443-TSH) TLB-PSA (Prostate Specific Antigen) (84153-PSA)  Problem # 5:  ABRASION  (ICD-919.0) Assessment: New  Orders:  Venipuncture [phone]) Prescription Created Electronically [phone]) TLB-Lipid Panel (80061-LIPID) TLB-BMP (Basic Metabolic Panel-BMET) (80048-METABOL) TLB-CBC Platelet - w/Differential (85025-CBCD) TLB-Hepatic/Liver Function Pnl (80076-HEPATIC) TLB-TSH (Thyroid Stimulating Hormone) (84443-TSH) TLB-PSA (Prostate Specific Antigen) (84153-PSA)  Complete Medication List: 1)  Plavix 75 Mg Tabs (Clopidogrel bisulfate) .... Take one tab once daily 2)  Prilosec Otc 20 Mg Tbec (Omeprazole magnesium) .... Take once daily 3)  Coreg 12.5 Mg Tabs (Carvedilol) .... Take one tab two times a day 4)  Lisinopril 2.5 Mg Tabs (Lisinopril) .... Take one  tab two times a day 5)  Lipitor 80 Mg Tabs (Atorvastatin calcium) .... Take one tab once daily 6)  Adult Aspirin Ec Low Strength 81 Mg Tbec (Aspirin) .... Once daily 7)  Nitrostat 0.4 Mg Subl (Nitroglycerin) .... As needed 8)  Colace 50 Mg Caps (Docusate sodium) .... As needed 9)  Keflex 500 Mg Caps (Cephalexin) .... 2 by mouth two times a day  Patient Instructions: 1)  It is important that you exercise regularly at least 20 minutes 5 times a week. If you develop chest pain, have severe difficulty breathing, or feel very tired , stop exercising immediately and seek medical attention. 2)  Schedule a colonoscopy/sigmoidoscopy to help detect colon cancer. 3)  Please schedule a follow-up appointment in 1 year. 4)  begin Keflex take two tabs b.i.d.  Keep the dressing clean and dry.  Return Monday for follow-up Prescriptions: NITROSTAT 0.4 MG SUBL (NITROGLYCERIN) as needed  #25 x 1   Entered and Authorized by:   Roderick Pee MD   Signed by:   Roderick Pee MD on 05/16/2009   Method used:   Electronically to        [facility] (retail)       [address]       [place], [place]  [address]       Ph: [phone] or [phone]       Fax: [phone]   RxID:   [id number] LIPITOR 80 MG TABS (ATORVASTATIN  CALCIUM) take one tab once daily  #100 x 3   Entered and Authorized by:   Roderick Pee MD   Signed by:   Roderick Pee MD on 05/16/2009   Method used:   Electronically to        [facility] (retail)       [address]       [place], [place]  [address]       Ph: [phone] or [phone]       Fax: [phone]   RxID:   [id number] LISINOPRIL 2.5 MG TABS (LISINOPRIL) take one  tab two times a day  #200 x 3   Entered and Authorized by:   Roderick Pee MD   Signed by:   Roderick Pee MD on 05/16/2009   Method used:   Electronically to        [facility] (retail)       [address]       [place], [place]  [address]       Ph: [phone] or [phone]       Fax: [phone]   RxID:   [id number] COREG 12.5 MG TABS (CARVEDILOL) take one tab two times a day  #200 x 3   Entered and Authorized by:   Roderick Pee MD   Signed by:   Roderick Pee MD on 05/16/2009   Method used:   Electronically to  [facility] (retail)       [address]       [place], [place]  [address]       Ph: [phone] or [phone]       Fax: [phone]   RxID:   [id number] PLAVIX 75 MG TABS (CLOPIDOGREL BISULFATE) take one tab once daily  #100 x 3   Entered and Authorized by:   Roderick Pee MD   Signed by:   Roderick Pee MD on 05/16/2009   Method used:   Electronically to        [facility] (retail)       [address]       [place], [place]  [address]       Ph: [phone] or [phone]       Fax: [phone]   RxID:   [id number] KEFLEX 500 MG CAPS (CEPHALEXIN) 2 by mouth two times a day  #40 x 1   Entered and Authorized by:   Roderick Pee MD   Signed by:   Roderick Pee MD on 05/16/2009   Method used:   Electronically to        [facility] (retail)       [address]       [place], [place]  [address]       Ph: [phone] or [phone]       Fax: [phone]   RxID:   [id number]

## 2010-06-09 NOTE — Assessment & Plan Note (Signed)
Summary: 2 day rov/njr   History of Present Illness: [name] [name] is a 77 year old male, who comes in today for recheck of a superficial scalp cellulitis.  He taking his oral medications and doing well.  The dressing was removed.  The scalp was cleaned.  It still is in os.pus. It's not as red or inflamed.  Allergies: 1)  ! Sulfa  Review of Systems      See HPI  Physical Exam  General:  Well-developed,well-nourished,in no acute distress; alert,appropriate and cooperative throughout examination Skin:  superficial cellulitis, scalp   Impression & Recommendations:  Problem # 1:  ABRASION (ICD-919.0) Assessment Improved  Complete Medication List: 1)  Plavix 75 Mg Tabs (Clopidogrel bisulfate) .... Take one tab once daily 2)  Prilosec Otc 20 Mg Tbec (Omeprazole magnesium) .... Take once daily 3)  Coreg 12.5 Mg Tabs (Carvedilol) .... Take one tab two times a day 4)  Lisinopril 2.5 Mg Tabs (Lisinopril) .... Take one  tab two times a day 5)  Lipitor 80 Mg Tabs (Atorvastatin calcium) .... Take one tab once daily 6)  Adult Aspirin Ec Low Strength 81 Mg Tbec (Aspirin) .... Once daily 7)  Nitrostat 0.4 Mg Subl (Nitroglycerin) .... As needed 8)  Colace 50 Mg Caps (Docusate sodium) .... As needed 9)  Keflex 500 Mg Caps (Cephalexin) .... 2 by mouth two times a day 10)  Doxycycline Hyclate 100 Mg Caps (Doxycycline hyclate) .... Take 1 tablet by mouth two times a day 11)  Silvadene 1 % Crea (Silver sulfadiazine) .... Apply daily  Patient Instructions: 1)  do daily dressing changes at home.  Continue the oral medication.  Return in 5 days for follow-up, sooner if any problems Prescriptions: SILVADENE 1 % CREA (SILVER SULFADIAZINE) apply daily  #200 gr x 1   Entered and Authorized by:   Roderick Pee MD   Signed by:   Roderick Pee MD on 05/21/2009   Method used:   Electronically to        [facility] (retail)       [address]       [place], [place]  [address]       Ph: [phone]  or [phone]       Fax: [phone]   RxID:   [id number]

## 2010-06-17 ENCOUNTER — Other Ambulatory Visit: Payer: Self-pay | Admitting: *Deleted

## 2010-06-17 DIAGNOSIS — I10 Essential (primary) hypertension: Secondary | ICD-10-CM

## 2010-06-17 MED ORDER — CARVEDILOL 12.5 MG PO TABS: 12.5000 mg | ORAL_TABLET | Freq: Two times a day (BID) | ORAL | Status: DC

## 2010-07-02 ENCOUNTER — Encounter: Payer: Self-pay | Admitting: Cardiology

## 2010-07-02 ENCOUNTER — Ambulatory Visit (INDEPENDENT_AMBULATORY_CARE_PROVIDER_SITE_OTHER): Payer: Medicare Other | Admitting: Cardiology

## 2010-07-02 DIAGNOSIS — E7800 Pure hypercholesterolemia, unspecified: Secondary | ICD-10-CM

## 2010-07-02 DIAGNOSIS — I2589 Other forms of chronic ischemic heart disease: Secondary | ICD-10-CM

## 2010-07-02 DIAGNOSIS — I2510 Atherosclerotic heart disease of native coronary artery without angina pectoris: Secondary | ICD-10-CM

## 2010-07-03 ENCOUNTER — Ambulatory Visit: Payer: Self-pay | Admitting: Family Medicine

## 2010-07-07 NOTE — Assessment & Plan Note (Signed)
Summary: f1y/dm-per pt call-mj-rs from [email or web address]  Medications Added AZASITE 1 % SOLN (AZITHROMYCIN) daily SYSTANE 0.4-0.3 % SOLN (POLYETHYL GLYCOL-PROPYL GLYCOL) daily * ULTRA FLORA PLUS daily CLOBETASOL PROPIONATE 0.05 % CREA (CLOBETASOL PROPIONATE) as directed * VINEGAR SOAKS daily        History of Present Illness: [name] [name] is a pleasant gentleman who has a history of coronary artery disease, ischemic cardiomyopathy, and hyperlipidemia.  He had a myocardial infarction in December 2004 complicated by ventricular fibrillation arrest.  He had PCI of his LAD at that time.  His most recent Myoview on May 19, 2007, showed an ejection fraction of 24%. There was a prior anterior, septal, and apical infarct, but there was no ischemia.  He did have 4-beat run of ventricular tachycardia.  He has declined ICD previously. Previous carotid Dopplers in July of 2006 showed 0-39% bilateral stenosis. Abdominal ultrasound at that time showed no aneurysm. I last saw him in Feb 2011. Since then the patient has dyspnea with more extreme activities but not with routine activities. It is relieved with rest. It is not associated with chest pain. There is no orthopnea, PND or pedal edema. There is no syncope or palpitations. There is no exertional chest pain.   Current Medications (verified): 1)  Plavix 75 Mg Tabs (Clopidogrel Bisulfate) .... Take One Tab Once Daily 2)  Coreg 12.5 Mg Tabs (Carvedilol) .... Take One Tab Two Times A Day 3)  Lisinopril 2.5 Mg Tabs (Lisinopril) .... Take One  Tab Two Times A Day 4)  Lipitor 80 Mg Tabs (Atorvastatin Calcium) .... Take One Tab Once Daily 5)  Adult Aspirin Ec Low Strength 81 Mg Tbec (Aspirin) .... Once Daily 6)  Nitrostat 0.4 Mg Subl (Nitroglycerin) .... As Needed 7)  Doxycycline Hyclate 100 Mg Caps (Doxycycline Hyclate) .... Take 1 Tablet By Mouth Two Times A Day 8)  Zantac 25 Mg/ml Soln (Ranitidine Hcl) .... Once A Day 9)  Azasite 1 % Soln  (Azithromycin) .... Daily 10)  Systane 0.4-0.3 % Soln (Polyethyl Glycol-Propyl Glycol) .... Daily 11)  Ultra Flora Plus .... Daily 12)  Clobetasol Propionate 0.05 % Crea (Clobetasol Propionate) .... As Directed 13)  Vinegar Soaks .... Daily  Allergies: 1)  ! Sulfa  Past History:  Past Medical History: HYPERTENSION (ICD-401.9) HYPERLIPIDEMIA (ICD-272.4) CORONARY ARTERY DISEASE (ICD-414.00) CARDIOMYOPATHY (ICD-425.4) GERD (ICD-530.81) ventricular fibrillation arrest in the emergency  room.  Past Surgical History: Reviewed history from 04/16/2008 and no changes required. cardiac catheter December 2004 stents Tonsillectomy  Social History: Reviewed history from 04/16/2008 and no changes required. Retired Married Former Smoker Alcohol use-no Drug use-no Regular exercise-yes  Review of Systems       no fevers or chills, productive cough, hemoptysis, dysphasia, odynophagia, melena, hematochezia, dysuria, hematuria, rash, seizure activity, orthopnea, PND, pedal edema, claudication. Remaining systems are negative.   Vital Signs:  Patient profile:   77 year old male Height:      68 inches Weight:      150 pounds BMI:     22.89 Pulse rate:   71 / minute Resp:     14 per minute BP sitting:   122 / 80  (left arm)  Vitals Entered By: Kem Parkinson (July 02, 2010 10:03 AM)  Physical Exam  General:  Well-developed well-nourished in no acute distress.  Skin is warm and dry.  HEENT is normal.  Neck is supple. No thyromegaly.  Chest is clear to auscultation with normal expansion.  Cardiovascular exam is regular rate and rhythm.  Abdominal exam nontender or distended. No masses palpated. Extremities show no edema. neuro grossly intact    EKG  Procedure date:  07/02/2010  Findings:      Sinus rhythm, prior anterior infarct, nonspecific ST changes.  Impression & Recommendations:  Problem # 1:  HYPERTENSION (ICD-401.9) Blood pressure controlled on present  medications. Will continue. Check potassium and renal function. His updated medication list for this problem includes:    Coreg 12.5 Mg Tabs (Carvedilol) .Marland Kitchen... Take one tab two times a day    Lisinopril 2.5 Mg Tabs (Lisinopril) .Marland Kitchen... Take one  tab two times a day    Adult Aspirin Ec Low Strength 81 Mg Tbec (Aspirin) ..... Once daily  His updated medication list for this problem includes:    Coreg 12.5 Mg Tabs (Carvedilol) .Marland Kitchen... Take one tab two times a day    Lisinopril 2.5 Mg Tabs (Lisinopril) .Marland Kitchen... Take one  tab two times a day    Adult Aspirin Ec Low Strength 81 Mg Tbec (Aspirin) ..... Once daily  Problem # 2:  HYPERLIPIDEMIA (ICD-272.4) Continue statin. Check lipids and liver. His updated medication list for this problem includes:    Lipitor 80 Mg Tabs (Atorvastatin calcium) .Marland Kitchen... Take one tab once daily  Problem # 3:  CORONARY ARTERY DISEASE (ICD-414.00) Continue aspirin, Plavix, beta blocker, ACE inhibitor and statin. Schedule Myoview for risk stratification. His updated medication list for this problem includes:    Plavix 75 Mg Tabs (Clopidogrel bisulfate) .Marland Kitchen... Take one tab once daily    Coreg 12.5 Mg Tabs (Carvedilol) .Marland Kitchen... Take one tab two times a day    Lisinopril 2.5 Mg Tabs (Lisinopril) .Marland Kitchen... Take one  tab two times a day    Adult Aspirin Ec Low Strength 81 Mg Tbec (Aspirin) ..... Once daily    Nitrostat 0.4 Mg Subl (Nitroglycerin) .Marland Kitchen... As needed  Orders: Nuclear Stress Test (Nuc Stress Test)  His updated medication list for this problem includes:    Plavix 75 Mg Tabs (Clopidogrel bisulfate) .Marland Kitchen... Take one tab once daily    Coreg 12.5 Mg Tabs (Carvedilol) .Marland Kitchen... Take one tab two times a day    Lisinopril 2.5 Mg Tabs (Lisinopril) .Marland Kitchen... Take one  tab two times a day    Adult Aspirin Ec Low Strength 81 Mg Tbec (Aspirin) ..... Once daily    Nitrostat 0.4 Mg Subl (Nitroglycerin) .Marland Kitchen... As needed  Problem # 4:  CARDIOMYOPATHY (ICD-425.4) History of ischemic cardiomyopathy.  Continue present medications. ICD again discussed and patient declines. He understands the risk of sudden death. His updated medication list for this problem includes:    Plavix 75 Mg Tabs (Clopidogrel bisulfate) .Marland Kitchen... Take one tab once daily    Coreg 12.5 Mg Tabs (Carvedilol) .Marland Kitchen... Take one tab two times a day    Lisinopril 2.5 Mg Tabs (Lisinopril) .Marland Kitchen... Take one  tab two times a day    Adult Aspirin Ec Low Strength 81 Mg Tbec (Aspirin) ..... Once daily    Nitrostat 0.4 Mg Subl (Nitroglycerin) .Marland Kitchen... As needed  Problem # 5:  GERD (ICD-530.81)  His updated medication list for this problem includes:    Zantac 25 Mg/ml Soln (Ranitidine hcl) ..... Once a day  Patient Instructions: 1)  Your physician wants you to follow-up in: one year  You will receive a reminder letter in the mail two months in advance. If you don't receive a letter, please call our office to schedule the follow-up appointment. 2)  Your physician has requested that you have  an lexiscan stress myoview.  For further information please visit [email or web address]  Please follow instruction sheet, as given. 3)  Your physician recommends that you return for lab work at time of stress test-lipid/liver/bmp-272.0/v58.69/414.8

## 2010-07-14 ENCOUNTER — Telehealth (INDEPENDENT_AMBULATORY_CARE_PROVIDER_SITE_OTHER): Payer: Self-pay | Admitting: *Deleted

## 2010-07-15 ENCOUNTER — Encounter: Payer: Self-pay | Admitting: Cardiology

## 2010-07-15 ENCOUNTER — Other Ambulatory Visit (INDEPENDENT_AMBULATORY_CARE_PROVIDER_SITE_OTHER): Payer: Medicare Other

## 2010-07-15 ENCOUNTER — Other Ambulatory Visit: Payer: Self-pay | Admitting: Cardiology

## 2010-07-15 ENCOUNTER — Encounter (INDEPENDENT_AMBULATORY_CARE_PROVIDER_SITE_OTHER): Payer: Self-pay | Admitting: *Deleted

## 2010-07-15 ENCOUNTER — Ambulatory Visit (HOSPITAL_COMMUNITY): Payer: Medicare Other | Attending: Cardiology

## 2010-07-15 DIAGNOSIS — R0989 Other specified symptoms and signs involving the circulatory and respiratory systems: Secondary | ICD-10-CM

## 2010-07-15 DIAGNOSIS — I2510 Atherosclerotic heart disease of native coronary artery without angina pectoris: Secondary | ICD-10-CM

## 2010-07-15 DIAGNOSIS — R0789 Other chest pain: Secondary | ICD-10-CM

## 2010-07-15 DIAGNOSIS — E785 Hyperlipidemia, unspecified: Secondary | ICD-10-CM

## 2010-07-15 LAB — HEPATIC FUNCTION PANEL
ALT: 20 U/L (ref 0–53)
AST: 23 U/L (ref 0–37)
Total Bilirubin: 0.8 mg/dL (ref 0.3–1.2)
Total Protein: 6.7 g/dL (ref 6.0–8.3)

## 2010-07-15 LAB — BASIC METABOLIC PANEL
BUN: 25 mg/dL — ABNORMAL HIGH (ref 6–23)
CO2: 25 mEq/L (ref 19–32)
Chloride: 109 mEq/L (ref 96–112)
Potassium: 4.1 mEq/L (ref 3.5–5.1)

## 2010-07-15 LAB — LIPID PANEL
Cholesterol: 114 mg/dL (ref 0–200)
LDL Cholesterol: 49 mg/dL (ref 0–99)

## 2010-07-21 NOTE — Assessment & Plan Note (Addendum)
Summary: Cardiology Nuclear Testing  Nuclear Med Background Indications for Stress Test: Evaluation for Ischemia, Stent Patency, PTCA Patency   History: Echo, Heart Catheterization, Myocardial Infarction, Myocardial Perfusion Study, Stents  History Comments: '04 AWMI w/ V. Fib>Stent-LAD; '05 Echo: EF=30-40%; '09 MPS:no ischemia, EF=24%  Symptoms: Chest Tightness, Chest Tightness with Exertion, DOE  Symptoms Comments: Last episode of JY:[id number]   Nuclear Pre-Procedure Cardiac Risk Factors: Carotid Disease, Family History - CAD, History of Smoking, Hypertension, Lipids Caffeine/Decaff Intake: None NPO After: 11:00 PM Lungs: Clear.  O2 sat 98% on RA. IV 0.9% NS with Angio Cath: 20g     IV Site: R Antecubital IV Started by: Stanton Kidney, EMT-P Chest Size (in) 38     Height (in): 68 Weight (lb): 148 BMI: 22.58  Nuclear Med Study 1 or 2 day study:  1 day     Stress Test Type:  Treadmill/Lexiscan Reading MD:  Willa Rough, MD     Referring MD:  Olga Millers, MD Resting Radionuclide:  Technetium 23m Tetrofosmin     Resting Radionuclide Dose:  11.0 mCi  Stress Radionuclide:  Technetium 53m Tetrofosmin     Stress Radionuclide Dose:  33.0 mCi   Stress Protocol Exercise Time (min):  2:00 min     Max HR:  129 bpm     Predicted Max HR:  144 bpm  Max Systolic BP: 163 mm Hg     Percent Max HR:  89.58 %Rate Pressure Product:  08657  Lexiscan: 0.4 mg   Stress Test Technologist:  [employer], CMA-N     Nuclear Technologist:  Domenic Polite, CNMT  Rest Procedure  Myocardial perfusion imaging was performed at rest 45 minutes following the intravenous administration of Technetium 36m Tetrofosmin.  Stress Procedure  The patient received IV Lexiscan 0.4 mg over 15-seconds with concurrent low level exercise and then Technetium 62m Tetrofosmin was injected at 30-seconds.  There were no significant changes with infusion, other than rare PVC's.  Quantitative spect images were obtained after a 45  minute delay.  QPS Raw Data Images:  Patient motion noted; appropriate software correction applied. Stress Images:  Dense scar in the anterior wall and apex Rest Images:  Same as stress Subtraction (SDS):  No evidence of ischemia. Transient Ischemic Dilatation:  1.07  (Normal <1.22)  Lung/Heart Ratio:  0.31  (Normal <0.45)  Quantitative Gated Spect Images QGS EDV:  154 ml QGS ESV:  108 ml QGS EF:  30 % QGS cine images:  Septal and apical akinesis   Overall Impression  Exercise Capacity: Lexiscan with low level exercise BP Response: Normal blood pressure response. Clinical Symptoms: No chest pain ECG Impression: No significant ST segment change suggestive of ischemia. Overall Impression Comments: There is old dense anterior and apical scar. There is no ischemia  Appended Document: Cardiology Nuclear Testing ok  Appended Document: Cardiology Nuclear Testing pt aware of results

## 2010-07-21 NOTE — Progress Notes (Signed)
Summary: Nuclear Pre-Procedure  Phone Note Outgoing Call Call back at [facility] Phone [phone]   Call placed by: Stanton Kidney, EMT-P,  July 14, 2010 2:09 PM Call placed to: Patient Action Taken: Phone Call Completed Summary of Call: Reviewed information on Myoview Information Sheet (see scanned document for further details).  Spoke with the patient. Stanton Kidney, EMT-P  July 14, 2010 2:10 PM      Nuclear Med Background Indications for Stress Test: Evaluation for Ischemia, Stent Patency, PTCA Patency   History: Echo, Myocardial Infarction, Myocardial Perfusion Study  History Comments: 12/04 AWMI w/ V. Fib > LAD stent 1/05 Echo: EF=30-40% 1/09 MPS: EF=24%, (-) ischemia  Symptoms: DOE    Nuclear Pre-Procedure Cardiac Risk Factors: Carotid Disease, Family History - CAD, History of Smoking, Hypertension, Lipids Height (in): 68

## 2010-07-21 NOTE — Letter (Signed)
Summary: Custom - Lipid  [facility], Main Office  [address]   [place], [place] [address]   Phone: [phone]  Fax: [phone]     July 15, 2010 MRN: [id number]   [facility] [address] [place], [place]  [address]   Dear Mr. GWYNNE,  We have reviewed your cholesterol results.  They are as follows:     Total Cholesterol:    114 (Desirable: less than 200)       HDL  Cholesterol:     46.70  (Desirable: greater than 40 for men and 50 for women)       LDL Cholesterol:       49  (Desirable: less than 100 for low risk and less than 70 for moderate to high risk)       Triglycerides:       94.0  (Desirable: less than 150)  Our recommendations include:These numbers look good. Continue on the same medicine. Sodium, potassium, kidney and  Liver function are normal. Take care, Dr. Darel Hong.    Call our office at the number listed above if you have any questions.  Lowering your LDL cholesterol is important, but it is only one of a large number of "risk factors" that may indicate that you are at risk for heart disease, stroke or other complications of hardening of the arteries.  Other risk factors include:   A.  Cigarette Smoking* B.  High Blood Pressure* C.  Obesity* D.   Low HDL Cholesterol (see yours above)* E.   Diabetes Mellitus (higher risk if your is uncontrolled) F.  Family history of premature heart disease G.  Previous history of stroke or cardiovascular disease    *These are risk factors YOU HAVE CONTROL OVER.  For more information, visit .  There is now evidence that lowering the TOTAL CHOLESTEROL AND LDL CHOLESTEROL can reduce the risk of heart disease.  The American Heart Association recommends the following guidelines for the treatment of elevated cholesterol:  1.  If there is now current heart disease and less than two risk factors, TOTAL CHOLESTEROL should be less than 200 and LDL CHOLESTEROL should be less than 100. 2.  If there is  current heart disease or two or more risk factors, TOTAL CHOLESTEROL should be less than 200 and LDL CHOLESTEROL should be less than 70.  A diet low in cholesterol, saturated fat, and calories is the cornerstone of treatment for elevated cholesterol.  Cessation of smoking and exercise are also important in the management of elevated cholesterol and preventing vascular disease.  Studies have shown that 30 to 60 minutes of physical activity most days can help lower blood pressure, lower cholesterol, and keep your weight at a healthy level.  Drug therapy is used when cholesterol levels do not respond to therapeutic lifestyle changes (smoking cessation, diet, and exercise) and remains unacceptably high.  If medication is started, it is important to have you levels checked periodically to evaluate the need for further treatment options.  Thank you,    [employer] Team

## 2010-07-27 ENCOUNTER — Encounter: Payer: Self-pay | Admitting: Family Medicine

## 2010-07-28 ENCOUNTER — Ambulatory Visit (INDEPENDENT_AMBULATORY_CARE_PROVIDER_SITE_OTHER): Payer: Medicare Other | Admitting: Family Medicine

## 2010-07-28 ENCOUNTER — Encounter: Payer: Self-pay | Admitting: Family Medicine

## 2010-07-28 DIAGNOSIS — I10 Essential (primary) hypertension: Secondary | ICD-10-CM

## 2010-07-28 DIAGNOSIS — I2510 Atherosclerotic heart disease of native coronary artery without angina pectoris: Secondary | ICD-10-CM

## 2010-07-28 DIAGNOSIS — E785 Hyperlipidemia, unspecified: Secondary | ICD-10-CM

## 2010-07-28 DIAGNOSIS — N400 Enlarged prostate without lower urinary tract symptoms: Secondary | ICD-10-CM

## 2010-07-28 DIAGNOSIS — K219 Gastro-esophageal reflux disease without esophagitis: Secondary | ICD-10-CM

## 2010-07-28 DIAGNOSIS — N401 Enlarged prostate with lower urinary tract symptoms: Secondary | ICD-10-CM

## 2010-07-28 LAB — POCT URINALYSIS DIPSTICK
Bilirubin, UA: NEGATIVE
Glucose, UA: NEGATIVE
Nitrite, UA: NEGATIVE
Spec Grav, UA: 1.015
Urobilinogen, UA: 0.2

## 2010-07-28 LAB — CBC WITH DIFFERENTIAL/PLATELET
Basophils Relative: 0.3 % (ref 0.0–3.0)
Eosinophils Absolute: 0.1 10*3/uL (ref 0.0–0.7)
Lymphocytes Relative: 30.4 % (ref 12.0–46.0)
MCHC: 33.6 g/dL (ref 30.0–36.0)
Monocytes Relative: 8.3 % (ref 3.0–12.0)
Neutrophils Relative %: 59.6 % (ref 43.0–77.0)
RBC: 4.72 Mil/uL (ref 4.22–5.81)
WBC: 6.7 10*3/uL (ref 4.5–10.5)

## 2010-07-28 LAB — PSA: PSA: 1 ng/mL (ref 0.10–4.00)

## 2010-07-28 LAB — TSH: TSH: 7.43 u[IU]/mL — ABNORMAL HIGH (ref 0.35–5.50)

## 2010-07-28 MED ORDER — ATORVASTATIN CALCIUM 80 MG PO TABS: 80.0000 mg | ORAL_TABLET | Freq: Every day | ORAL | Status: DC

## 2010-07-28 MED ORDER — NITROGLYCERIN 0.4 MG SL SUBL: 0.4000 mg | SUBLINGUAL_TABLET | SUBLINGUAL | Status: DC | PRN

## 2010-07-28 MED ORDER — CLOPIDOGREL BISULFATE 75 MG PO TABS: 75.0000 mg | ORAL_TABLET | Freq: Every day | ORAL | Status: DC

## 2010-07-28 MED ORDER — LISINOPRIL 2.5 MG PO TABS: 2.5000 mg | ORAL_TABLET | Freq: Two times a day (BID) | ORAL | Status: DC

## 2010-07-28 MED ORDER — CARVEDILOL 12.5 MG PO TABS: 12.5000 mg | ORAL_TABLET | Freq: Two times a day (BID) | ORAL | Status: DC

## 2010-07-28 NOTE — Progress Notes (Signed)
  Subjective:    Patient ID: [name] [name], male    DOB: 1931-12-27, 77 y.o.   MRN: [id number]  [name] Is a delightful, 77 year old, married male, nonsmoker, who comes in today for general physical examination because of a history of hypertension, diabetes, and coronary disease and Gerd.  For hyperlipidemia takes Lipitor 80 mg nightly lipid panel done last week.  Her coronary disease and congestive heart failure.  He also takes cord 12.5 mg daily, Plavix, 75 mg daily, lisinopril 2.5 mg daily, BP today 160/92, however, he says his blood pressures at home and at cardiology two weeks ago, 130/64.  He takes Zantac to prevent reflux and aspirin tablet daily.  He is also on his scalp representation because he has severe scalp disease, etiology unknown    Review of Systems  Constitutional: Negative.   HENT: Negative.   Eyes: Negative.   Respiratory: Negative.   Cardiovascular: Negative.   Gastrointestinal: Negative.   Genitourinary: Negative.   Musculoskeletal: Negative.   Skin: Negative.   Neurological: Negative.   Hematological: Negative.   Psychiatric/Behavioral: Negative.        Objective:   Physical Exam  Constitutional: He is oriented to person, place, and time. He appears well-developed and well-nourished.  HENT:  Head: Normocephalic and atraumatic.  Right Ear: External ear normal.  Left Ear: External ear normal.  Nose: Nose normal.  Mouth/Throat: Oropharynx is clear and moist.  Eyes: Conjunctivae and EOM are normal. Pupils are equal, round, and reactive to light.  Neck: Normal range of motion. Neck supple. No JVD present. No tracheal deviation present. No thyromegaly present.  Cardiovascular: Normal rate, regular rhythm, normal heart sounds and intact distal pulses.  Exam reveals no gallop and no friction rub.   No murmur heard. Pulmonary/Chest: Effort normal and breath sounds normal. No stridor. No respiratory distress. He has no wheezes. He has no rales. He exhibits  no tenderness.  Abdominal: Soft. Bowel sounds are normal. He exhibits no distension and no mass. There is no tenderness. There is no rebound and no guarding.  Genitourinary: Rectum normal and penis normal. Guaiac negative stool. No penile tenderness.       2+ symmetrical.  BPH  Musculoskeletal: Normal range of motion. He exhibits no edema and no tenderness.  Lymphadenopathy:    He has no cervical adenopathy.  Neurological: He is alert and oriented to person, place, and time. He has normal reflexes. No cranial nerve deficit. He exhibits normal muscle tone.  Skin: Skin is warm and dry. No rash noted. No erythema. No pallor.  Psychiatric: He has a normal mood and affect. His behavior is normal. Judgment and thought content normal.          Assessment & Plan:  Hyperlipidemia continue medications.  Hypertension.  Continue medication.  Coronary disease, asymptomatic continue yearly follow up in cardiology.  Reflux esophagitis.  Continue Zantac

## 2010-07-28 NOTE — Patient Instructions (Signed)
Continue your current medications follow-up in one year or sooner if any problems 

## 2010-09-22 NOTE — Assessment & Plan Note (Signed)
HEALTHCARE                            CARDIOLOGY OFFICE NOTE   NAME:[name] [name], [name] [name]                      MRN:          [id number]  DATE:05/17/2007                            DOB:          [birth year]    HISTORY:  Mr. Convey is a very pleasant gentleman who has history of  coronary disease, ischemic cardiomyopathy and hyperlipidemia.  He did  suffer a myocardial infarction in December 2004 complicated by  ventricular fibrillation arrest.  He had PCI of his LAD at that time.  Since I last saw him, he is doing well with no dyspnea, chest pain,  palpitations or syncope.  There has been no pedal edema.  Note, he does  not smoke.  He is exercising routinely and following a diet.   MEDICATIONS:  1. Plavix 75 mg p.o. daily.  2. Lipitor 80 mg p.o. daily.  3. Aspirin 81 mg p.o. daily.  4. Prilosec 20 mg p.o. daily.  5. Lisinopril 2.5 mg p.o. daily.  6. Coreg 12.5 mg p.o. b.i.d.   PHYSICAL EXAMINATION:  VITAL SIGNS:  Blood pressure 130/80, his pulse is  70, he weighs 153 pounds.  HEENT:  Normal.  NECK:  Supple with no bruits.  CHEST:  Clear.  CARDIOVASCULAR:  Regular rate and rhythm.  ABDOMEN:  Shows no tenderness.  EXTREMITIES:  Show no edema.   DIAGNOSTICS:  Electrocardiogram today shows a sinus rhythm at a rate of  70.  The axis is normal.  There is a prior septal infarct and  nonspecific ST changes are noted.   DIAGNOSES:  1. Coronary artery disease.  The patient will continue on his aspirin,      Plavix, statin, Coreg and ACE inhibitor.  We will also plan to      repeat a Myoview for risk stratification.  2. Ischemic cardiomyopathy.  We will increase his lisinopril to 2.5 mg      p.o. b.i.d.  Note, his systolic blood pressure runs in the 95-100      range at home.  He has had difficulties with increasing medications      in the past as it has caused presyncope.  If he tolerates this, we      will continue it at 2.5 b.i.d.  If not, we will  resume to 2.5      daily.  He will continue on the same dose of Coreg.  We will check      a BMET 1 week after increasing his lisinopril.  3. Hyperlipidemia.  He will continue on a statin.  We will check      lipids and liver when he returns for his Myoview.  4. History of hypertension.  His blood pressure is adequately      controlled.  5. Sudden cardiac death risk.  We discussed again whether he would be      interested in  considering an ICD given his reduced LV function.      However, he is not interested and understands the risks of sudden      death.   FOLLOW UP:  I  will see him back in 12 months.     Madolyn Frieze Jens Som, MD, [facility]  Electronically Signed    BSC/MedQ  DD: 05/17/2007  DT: 05/17/2007  Job #: [id number]

## 2010-09-22 NOTE — Assessment & Plan Note (Signed)
[facility]ALTHCARE                            CARDIOLOGY OFFICE NOTE   NAME:Melander, MANSOOR HILLYARD                      MRN:          [id number]  DATE:05/23/2008                            DOB:          08-16-1931    Mr. Sciascia is a pleasant gentleman who has a history of coronary artery  disease, ischemic cardiomyopathy, and hyperlipidemia.  He had a  myocardial infarction in December 2004 complicated by ventricular  fibrillation arrest.  He had PCI of his LAD at that time.  His most  recent Myoview on May 19, 2007, showed an ejection fraction of 24%.  There was a prior anterior, septal, and apical infarct, but there was no  ischemia.  He did have 4-beat run of ventricular cardia.  Since I last  saw him, he is doing well from symptomatic standpoint.  There is no  dyspnea, chest pain, palpitations or syncope, and there is no pedal  edema.   MEDICATIONS:  1. Plavix 75 mg p.o. daily.  2. Lipitor 80 mg p.o. daily.  3. Aspirin 81 mg p.o. daily.  4. Prilosec 20 mg p.o. daily.  5. Lisinopril 2.5 mg p.o. daily.  6. Coreg 12.5 mg p.o. b.i.d.   PHYSICAL EXAMINATION:  VITAL SIGNS:  Blood pressure of 159/88 and his  pulse is 74.  HEENT:  Normal.  NECK:  Supple.  No bruits.  CHEST:  Clear.  CARDIOVASCULAR:  Regular rate and rhythm.  ABDOMEN:  No tenderness.  EXTREMITIES:  No edema.   His electrocardiogram shows a sinus rhythm at a rate of 74.  There is a  prior septal infarct and there is anterior T-wave inversion which is  unchanged.   DIAGNOSES:  1. Ischemic cardiomyopathy - The patient will continue on his ACE      inhibitor, beta-blocker, statin, and aspirin.  I am hesitant to try      to increase his medications further as he has had problems with      hypotension in the past.  We also discussed again the potential      benefit of an ICD, but he again declined this.  He understands the      risk of sudden death.  2. Coronary artery disease - He will  continue on his aspirin, Plavix,      statin, ACE inhibitor, and beta-blocker.  3. Hyperlipidemia - His most recent lipids and liver were outstanding.      He will continue on his present dose of Lipitor.  4. Hypertension - His blood pressure is elevated today, but he states      it is because he is in the office.  He typically runs in the 100/60      range at home.  We will continue with his present medications and I      will check a BMET to follow his potassium and renal function.  5. History of hypertension - As per above.   I will see him back in 1 year or sooner if necessary.  He will continue  with the risk factor modification including diet and exercise.  He does  not smoke.     Madolyn Frieze Jens Som, MD, [facility]  Electronically Signed    BSC/MedQ  DD: 05/23/2008  DT: 05/23/2008  Job #: [id number]

## 2010-09-25 NOTE — Assessment & Plan Note (Signed)
[facility]ALTHCARE                            CARDIOLOGY OFFICE NOTE   NAME:[name] [name], [name] [name]                      MRN:          [id number]  DATE:08/24/2006                            DOB:          20-Sep-1931    [name] [name] returns for followup today.  He has a history of coronary  disease, dating back to December of 2004.  Patient suffered a myocardial  infarction and had a ventricular fibrillation arrest in the emergency  room.  He subsequently underwent PCI of his LAD.  His ejection fraction  was 30% at the time.  His most recent nuclear study was performed in  October of 2006 that showed an ejection fraction of 34%.  There was a  prior apical infarct, but no ischemia was noted.  Since I last saw him,  he denies any dyspnea, chest pain, palpitations or syncope.  There is no  pedal edema.  He is exercising routinely and trying to follow the diet.  Note:  He does not smoke.   MEDICATIONS INCLUDE:  1. Plavix 75 mg p.o. daily.  2. Lipitor 80 mg p.o. daily.  3. Aspirin 81 mg p.o. daily.  4. Prilosec 20 mg p.o. daily.  5. Lisinopril 2.5 mg p.o. daily.  6. Coreg 12.5 mg p.o. b.i.d.   PHYSICAL EXAM:  Shows a blood pressure of 139/81 and his pulse is 70.  NECK:  Supple with no bruits.  CHEST:  Clear.  CARDIOVASCULAR EXAM:  Reveals a regular rate and rhythm.  EXTREMITIES:  Showed no edema.   Electrocardiogram shows a sinus rhythm at a rate of 70.  He has a prior  anterior infarct with anterolateral T-wave changes.   DIAGNOSES:  1. Coronary artery disease - the patient has had no chest pain or      shortness of breath.  We will continue with medical therapy,      including his aspirin, Plavix, Lipitor, Coreg and lisinopril.  2. Ischemic cardiomyopathy - I would like to increase his ACE      inhibitor and beta blocker.  However, he states that his blood      pressure typically runs in the 99/50 range at home.  He also has      had presyncope with increasing  his beta blocker in the past.  We      will try to increase his lisinopril to 2.5 mg p.o. b.i.d. to see if      he tolerates this.  I will check a BMET in one week, follow      potassium and renal function.  3. Hyperlipidemia - we will check fasting lipids and liver when he      returns for his BMET.  He will adjust his regimen as indicated with      goal LDL of less than 70.  4. Hypertension - blood pressure well-controlled on present      medications.  5. Sudden cardiac death risk - we again discussed the risk of sudden      cardiac death, given his ischemic cardiomyopathy.  I recommended an  ICD, but he is not interested in pursuing this and understands the      risks.   We will see him back in nine months.     [name] [name] [name] Som, MD, [facility]  Electronically Signed    BSC/MedQ  DD: 08/24/2006  DT: 08/24/2006  Job #: [id number]

## 2010-09-25 NOTE — Assessment & Plan Note (Signed)
[facility]                              CARDIOLOGY OFFICE NOTE   NAME:[name] [name], [name] [name]                      MRN:          [id number]  DATE:02/08/2006                            DOB:          1932/02/17    [name] is a pleasant gentleman with a history of coronary disease, and  ischemic cardiomyopathy.  Since I last saw him there is no dyspnea on  exertion, orthopnea, PND, pedal edema, palpitations, presyncope, syncope, or  chest pain.  He is exercising routinely and he is following a diet.  He does  not smoke.   His medications include Plavix 75 mg p.o. daily, aspirin 81 p.o. daily,  Lipitor 80 mg p.o. daily, Prilosec, lisinopril 2.5 mg p.o. daily and Coreg  12.5 mg p.o. b.i.d.   PHYSICAL EXAMINATION:  VITAL SIGNS:  Blood pressure of 140/80, pulse is 61.  NECK:  His neck is supple.  CHEST:  Clear.  CARDIOVASCULAR:  There is regular rhythm.  EXTREMITIES:  No edema.   Electrocardiogram shows a sinus rhythm at a rate of 66.  There is a prior  septal infarct with anterior T wave inversion.   DIAGNOSES:  1. Coronary artery disease.  2. Ischemic cardiomyopathy.  3. Hyperlipidemia.  4. Hypertension.   PLAN:  [name] is doing extremely well from a symptomatic standpoint.  His pressure is mildly increased today but in general it runs in the 95-100  range systolic at home.  He also has mild presyncope at times with standing.  I will therefore not advance his medications, and he has tolerated this in  the past.  We again discussed the possibility of his being seen by an  electrophysiologist, as his previous ejection fraction on nuclear study was  34%.  However, he has declined that, and he understands the risks of sudden  death.  He will continue with his present medications as well as risk factor  modification, and I will see him back in 6 months.  I have instructed him to  call me if he changes his mind about an electrophysiological  evaluation.           ______________________________  Madolyn Frieze Jens Som, MD, [facility]    BSC/MedQ  DD:  02/08/2006  DT:  02/09/2006  Job #:  [id number]

## 2010-09-25 NOTE — Cardiovascular Report (Signed)
[name] [name], [name] [name]                           ACCOUNT NO.:  [id number]   MEDICAL RECORD NO.:  [id number]                   PATIENT TYPE:  INP   LOCATION:  1828                                 FACILITY:  [facility]   PHYSICIAN:  Charlies Constable, M.D.                  DATE OF BIRTH:  1932/03/21   DATE OF PROCEDURE:  05/10/2003  DATE OF DISCHARGE:                              CARDIAC CATHETERIZATION   PROCEDURE:  Right and left heart catheterization, intra-aortic balloon  insertion and selective coronary angiography  and stent implantation.   INDICATIONS FOR PROCEDURE:  [name] [name] is 77 years old and had the onset of  chest pain at  8 p.m. this evening  and presented to the [facility]  emergency room. While in triage he arrested and was defibrillated from  ventricular fibrillation and required  intubation. He was intubated and  taken promptly to the catheterization laboratory. His ECG showed a large  anterior wall myocardial infarction with marked anterior ST elevation.   DESCRIPTION OF PROCEDURE:  The procedure was performed via the right femoral  artery using an arterial sheath and 6 [place] free form coronary catheters.  Right heart catheterization was performed percutaneously through the right  femoral vein using a mini sheath and Swann-Ganz ________ catheter. After  completion of the diagnostic study, we placed an intra-aortic balloon  pump  via the left femoral artery using a 40 mL Datascope balloon pump. We then  made the decision to perform percutaneous coronary intervention on the LAD.   The patient had been given heparin and aspirin in the emergency department  and his ACT was over 200 seconds on arrival. We also gave a double bolus of  Integrilin infusion. We gave Plavix per NG tube at the end of the procedure.  We used a Q 28 [place] guiding catheter with sideholes and a PT2 life support  wire. We crossed the totally occluded proximal LAD with the wire with a  moderate  amount of difficulty. We then dilated with a 2.25 x 20 mm Maverick,  perform 2 inflations up to 8 atmospheres for 30 seconds. We then deployed a  2.25 x 18 mm Pixel, deploying this with 1 inflation of 12 atmospheres for 30  seconds. Repeat diagnostic study was then performed through the guide  catheter.   The patient was intubated and sedated during the procedure. We gave Pavulon  and additional Versed and Fentanyl during the procedure. With this regimen  he remained stable during the procedure, although his blood pressure varied  from 60 to 100 systolic. He left the laboratory in critical but stable  condition.   RESULTS:  Left main coronary artery:  The left main coronary was free of  significant disease.  Left anterior descending coronary artery:  The left anterior descending  coronary artery was completely clear after a small  diagonal branch and a  small  septal perforator.  Circumflex artery: The circumflex artery gave rise to a small  marginal  branch, a large marginal branch and an AV branch which terminated in a  posterolateral branch. There was 80% stenosis in the proximal 2nd marginal  branch and 70% stenosis in one of the subbranches.  Right coronary artery: The right coronary artery was a large dominant vessel  which gave rise to a conus branch, right ventricular branch, a posterior  descending branch and 3 posterolateral branches. There was 50% narrowing in  the proximal right coronary artery. There was 80% narrowing in the mid right  coronary artery. There was 80% narrowing in the posterior descending branch.  Left ventriculogram:  The left ventriculogram performed in the RAO  projection showed hypokinesis of the anterolateral wall and akinesis of the  apex and hypokinesis of the mid inferior wall. The inferior wall near the  base and the anterior wall near the base moved vigorously. The estimated  ejection fraction was 30%.  Distal aortogram:  A distal aortogram was  performed which showed patent  renal arteries and no significant aortoiliac obstruction.   Following  stenting of the lesion of the proximal LAD, the stenosis improved  from 100% to less than 10% and the flow improved  from TIMI 0 to TIMI 3  flow. The distal vessel gave rise to 2 more diagonal branches and a septal  perforator. The vessel was diffusely diseased distally with only a 2.25 mm  lumen and a large main. There were no critical lesions beyond the total  occlusion, however.   HEMODYNAMIC DATA:  The right atrial pressure was 13 mean. The pulmonary  artery pressure was 30/19 with a mean of 23. The pulmonary wedge pressure  was 18 and rose to 25 near the end of the procedure. The left ventricular  pressure was 69. There was an end diastolic pressure  of 18 prior to  Dopamine. The aortic pressure was 69/48 with a mean of 57. The cardiac  output/cardiac index was 4.2/2.2 liters/minute per meter squared by Fick.   The patient had the onset of chest pain at 2000 and arrived at [facility] emergency department at 2115. The first balloon inflation was  performed at 2326 after placement of a Swann-Ganz catheter, an intra-aortic  balloon  pump and after previous  defibrillation and intubation in the  emergency department. This gave a total balloon  time of 2 hours and 11  minutes and a reperfusion time of 3 hours and 26 minutes.   CONCLUSION:  1. Acute anterior wall myocardial infarction complicated by cardiogenic     shock and ventricular fibrillation and cardiac arrest, requiring     defibrillation and intubation.  2. Severe coronary artery disease with total occlusion of the proximal left     anterior descending artery,  80% and 70% stenosis in the marginal branch     of the circumflex artery, 50% proximal  and 80% mid stenosis in the right     coronary artery with 80% narrowing in the posterior descending branch and    anterolateral wall hypokinesis and apical wall akinesis  with an estimated     ejection fraction of 30%.  3. Placement of intra-aortic balloon  pump.  4. Successful stenting of the lesion  in the proximal LAD with improvement     of narrowing from 100% to less than 10%.   DISPOSITION:  The patient was taken to the recovery room for further  observation. The  patient's outlook remains quite guarded. I have asked Dr.  Sherene Sires to help with management  of a ventilator.                                               Charlies Constable, M.D.    BB/MEDQ  D:  05/11/2003  T:  05/11/2003  Job:  [id number]   cc:   Olga Millers, M.D.

## 2010-09-25 NOTE — Discharge Summary (Signed)
[name] [name], [name] [name]                           ACCOUNT NO.:  [id number]   MEDICAL RECORD NO.:  [id number]                   PATIENT TYPE:  INP   LOCATION:  3730                                 FACILITY:  [facility]   PHYSICIAN:  Olga Millers, M.D.                DATE OF BIRTH:  1932/01/28   DATE OF ADMISSION:  05/10/2003  DATE OF DISCHARGE:  05/28/2003                                 DISCHARGE SUMMARY   DISCHARGE DIAGNOSES:  1. Admitted with acute anterior myocardial infarction, cardiac arrest in the     emergency room with defibrillation/intubation and placement of intra-     aortic balloon pump, then for emergent left heart catheterization.  2. Ventilator-dependent respiratory failure. The patient maintained on the     ventilator for 11 days with some evidence of altered mental status which     slowly sharpens into focus towards extubation, extubated without     difficulty on day 11. CT of the head was negative for acute intracranial     abnormality.  3. Post catheterization atrial fibrillation with rapid ventricular rated,     controlled with IV amiodarone, converted to sinus rhythm, amiodarone     discontinued and patient now on Coreg.  4. Prolonged hypotension post myocardial infarction, now resolving, but     hampering efforts to advance medical therapy.  5. Congestive heart failure with left ventricular dysfunction secondary to     ischemia, ejection fraction 25 to 35%.  6. Increased glucose intolerance, a new finding, patient placed on     Glucotrol.  7. Hypertension.  8. Hematochezia. Followup colonoscopy in three months, Dr. Melvia Heaps.  9. Coumadin for apical wall motion abnormality for the next three to six     months.   SECONDARY DIAGNOSES:  1. Hypertension, his only known medical complication prior to this     admission.  2. The patient had coronary artery disease known in his father.   PROCEDURE:  1. May 09, 2004, emergent left heart catheterization. This  study showed     that the right coronary artery had a proximal 50% stenosis, mid point 80%     stenosis. The PDA had an 80% stenosis. Left circumflex had an 80% after a     ramus intermedius, and the second obtuse marginal had a 70% stenosis. The     LAD was 100% occluded after the first diagonal. Stent was placed there at     2.25 x 18 Dixie stent, reducing the 100% stenosis to 0, ejection fraction     30%. Finding of apical akinesis and hypokinesis of the inferior wall.  2. Computed tomogram of the head May 17, 2003:  No acute intracranial     abnormality discovered.  3. Echocardiogram May 23, 2003:  Left ventricular ejection fraction 25     to 35%. Left ventricular systolic function moderately to markedly     decreased. Akinesis  with scarring of the entire periapical wall. There is     akinesis of the mid to distal anterior wall, akinesis of the anteroseptal     wall. Doppler parameters consistent with restrictive physiology,     indicative of decreased left ventricular diastolic compliance. Aortic     valve is mildly calcified with trivial aortic regurgitation. There is     normal mitral valve leaflet excursion with trivial mitral valvular     regurgitation. Right ventricular was grossly normal. Minimal pericardial     effusion without hemodynamic compromise.   DISCHARGE DISPOSITION:  [name] [name] is ready for January 18. He has  had some bright red blood per rectum secondary to external hemorrhoids and  was seen by Dr. Arlyce Dice, GI service, who recommended outpatient colonoscopy.  This to be done after anticoagulation for stent placement and for wall  motion abnormality in the left ventricle had been adequately anticoagulated  for about three months. His mental status is clear. He has been afebrile and  will discharge on the following medications:  1. Plavix 75 mg daily.  2. Enteric-coated aspirin 81 mg daily _________ to keep the stent open.  3. Coreg 6.25 mg twice  daily.  4. Pravachol 20 mg daily at bedtime.  5. Altace 5 mg twice daily.  6. Protonix 40 mg daily.  7. Glucotrol 2.5 mg daily.  8. Coumadin 2 mg tablets; no Coumadin to be taken January 18 or January 19     and resume 2 g daily January 20. He will present to the Coumadin clinic     Friday May 30, 2002 at 10:45 in the morning.  9. Nitroglycerin 0.4 mg one tablet under the tongue every 5 minutes x3 as     needed for chest pain.   PAIN MANAGEMENT:  Not applicable.   He is to use Tuck's pads for rectal care, wiping as needed with presence of  hemorrhoids. Also Anusol rectal suppository up to two times daily as needed  for rectal bleeding, and also Colace 100 mg daily over the counter as stool  softener. He is to engage in no strenuous activity.   DISCHARGE DIET:  Low sodium, low cholesterol diet.   FOLLOW UP:  Once again, he follows up at the Coumadin clinic Friday May 31, 2003 at 10:45. He will see  Dr. Olga Millers Monday June 10, 2003 at 10:45 p.m. with a CBC study,  and he will have an echocardiogram Tuesday, August 20, 2003 at 9:30 in the  morning. This is to assess ejection fraction, and if the ejection fraction  is less than 30%, he will have electrophysiology consult for the  applicability of ICD placement. He is also to follow up with Dr. Tawanna Cooler who  will manage his glucose intolerance. This is a new finding on his admission.   BRIEF HISTORY:  [name] [name] is a 77 year old gentleman. He has no prior  cardiac history. He presents on December 31 with acute anterior myocardial  infarction. He had cardiac arrest in the emergency room while awaiting  treatment, was intubated, and placed on intra-aortic balloon pump. The  patient's wife said he just developed severe substernal chest pain  approximately one and one half hours prior to his presentation. He took  three aspirin and came to the emergency room. He is presently in normal sinus rhythm and will be taken  emergently to the catheterization lab.   HOSPITAL COURSE:  [name] [name] was admitted to [facility] by way  of  the catheterization lab on December 31, having undergone immediate cardiac  arrest which was witnessed and defibrillated in the emergency room. He was  also intubated at that time. He was found to have 100% occlusion of the left  anterior descending after the first diagonal. This was reduced to less than  10% occlusion by placement of a ___________ stent with reestablishment of  TIMI III flow. He does have residual disease in the left circumflex and in  the right coronary artery as well as the PDA as dictated above. He had  remained ventilated for 11 days and was seen in constant surveillance by the  pulmonary critical care medical personnel. He was maintained on IV  vancomycin secondary to the large amount of interventional lines that had  been placed. He did spike fevers in the immediate post procedure period, and  because of this, blood cultures were obtained. Only one blood culture grew  gram positive cocci in cluster. This was eventually viewed as a contaminant;  however, he was also then started on IV Zosyn. In the post procedure period,  he also developed atrial fibrillation with rapid ventricular rate which was  converted to sinus rhythm on IV amiodarone. This was subsequently  discontinued. The patient was maintained then on Coreg 6.25 mg twice daily.  There was some question that the patient might have altered mental status.  He was heavily sedated on the ventilator and eventually his pulmonary  function, his cardiac function allowed extubation. A followup computed  tomogram of the head showed no evidence of acute intracranial abnormality.  The patient has regained his baseline mental status. He has been maintained  on IV heparin while intubated for apical akinesis and then switched to  Coumadin therapy when able to take oral medications. Beta blocker consisting   of Coreg and ACE-1 inhibitor consisting of Altace were added and titrated  upward as blood pressure allowed. The patient was fairly hypotensive after  his anterior myocardial infarction for several days but eventually allowed  medications to be up titrated, Altace 5 mg twice daily and Coreg 6.25 mg  twice daily. Because of consistently elevated serum glucose levels, the  patient was started on 2.5 mg of Glucotrol which seemed to regulate serum  glucose well. Followup echocardiogram showed that ejection fraction had  improved from 30% at the time of catheterization to 25 to 35% 13 days later.  He will have a followup echocardiogram in 12 weeks at [facility] cardiology  office in [place]. The patient did develop hematochezia toward the end of  his hospitalization. He was seen by Dr. Arlyce Dice of the GI service who  recommended treatment for his external hemorrhoids as well as colon  screening after full recovery from MI and after treatment with anticoagulants for sequelae of MI. The patient's INR at the time of  discharge was 4.6. Coumadin will be held for two days and then restarted at  2 mg daily on January 20. Complete blood count on January 15:  White cells  12.5, hemoglobin 11.3, hematocrit 34, platelets 468. Serum electrolytes on  January 16:  Sodium 139, potassium 4.6, chloride 109, bicarbonate 23,  glucose 81, BUN 24, creatinine 1.6. Liver function tests on January 12:  SGOT 130, SGPT 243, alkaline phosphatase 98; however, by January 16, his  liver function tests had abated. The SGOT was 25, SGPT 96, alkaline  phosphatase 67. Hemoglobin A1c was 6.4 on January 10. Once blood cultures  were taken on January 3:  The  left arm showed no growth for 5 days. The  arterial line showed staphylococcus coagulase-negative species and was  probably a contaminant.      Maple Mirza, P.A.                    Olga Millers, M.D.    GM/MEDQ  D:  05/28/2003  T:  05/29/2003  Job:  [id number]   cc:    Tawanna Cooler, M.D.   Barbette Hair. Arlyce Dice, M.D. [facility]

## 2010-09-25 NOTE — H&P (Signed)
[name], [name]                           ACCOUNT NO.:  [id number]   MEDICAL RECORD NO.:  [id number]                   PATIENT TYPE:  INP   LOCATION:  1828                                 FACILITY:  [facility]   PHYSICIAN:  Olga Millers, M.D.                DATE OF BIRTH:  01-Aug-1931   DATE OF ADMISSION:  05/10/2003  DATE OF DISCHARGE:                                HISTORY & PHYSICAL   HISTORY OF PRESENT ILLNESS:  [name] [name] is a 77 year old gentleman with no  prior cardiac history who presents with an acute anterior myocardial  infarction.  He has no prior cardiac history.  All of his history is  obtained from his wife as the patient was intubated at the time of the  evaluation following a cardiac arrest.  According to the patient's wife he  developed severe substernal chest pain approximately 1-1/2 hours prior to  presentation.  He took three aspirin and came to the emergency room.  While  in the waiting room he arrested, requiring defibrillation.  He then had  atrial fibrillation with evidence of anterior ST elevation and we were asked  to evaluate.  He is presently normal sinus rhythm.   ALLERGIES:  He has no known drug allergies.   SOCIAL HISTORY:  He occasionally smokes a cigar per his wife and he  occasionally consumes alcohol.   FAMILY HISTORY:  Positive for coronary artery disease in his father.   PAST MEDICAL HISTORY:  1. Significant for hypertension, but there is no diabetes mellitus or     hyperlipidemia.  2. He has had a prior tonsillectomy but there is no other past medical     history noted per his wife.   REVIEW OF SYSTEMS:  Not obtainable as the patient is intubated and sedated.  She did state that he occasionally has hematochezia related to hemorrhoids.   PHYSICAL EXAMINATION:  VITAL SIGNS:  His heart rate at the time of his  evaluation was 110 and his blood pressure was 170/80.  GENERAL:  He was unresponsive and intubated and mildly cyanotic following  his cardiac arrest.  He is well developed and well nourished.  He does not  have peripheral clubbing.  HEENT:  His pupils were equally round.  There was no evidence of trauma to  his head.  NECK:  Supple and there is normal upstroke bilaterally.  I cannot appreciate  bruits although his exam is somewhat limited as he was being bagged at the  time of the evaluation.  CHEST:  Clear to auscultation anteriorly.  CARDIOVASCULAR:  He has a regular rate and rhythm with normal S1 and S2.  There are no murmurs, rubs, or gallops noted.  ABDOMEN:  Shows no distention.  Cannot appreciate hepatosplenomegaly or  masses.  I cannot appreciate a bruit.  EXTREMITIES:  He has 2+ femoral pulses bilaterally and no bruits.  His  extremities show no  edema and I can palpate no cords.  He has 2+ posterior  tibial pulses bilaterally.  NEUROLOGIC:  Not obtainable, as the patient is status post cardiac arrest.   LABORATORY DATA:  His electrocardiogram shows atrial fibrillation with a  rapid ventricular response of 178.  This did convert to sinus.  He has  approximately 10 mm of ST elevation in lead V2 and lesser degrees in leads  V1, V3, and V4, as well as V5.   DIAGNOSES:  1. Acute anterior myocardial infarction.  2. Status post cardiac arrest secondary to #1.  3. Hypertension.   PLAN:  [name] [name] presents with an acute anterior myocardial infarction  with a complication of cardiac arrest.  He is now in normal sinus rhythm.  We will plan to treat him with aspirin, Lopressor, heparin, ACE inhibitor,  and Nystatin.  We will also cycle enzymes.  We will ask the critical care  physicians to assist with his ventilator management.  The risks and benefits  of emergent cardiac catheterization have been discussed with the patient's  wife and she agrees to proceed.                                                Olga Millers, M.D.    BC/MEDQ  D:  05/10/2003  T:  05/10/2003  Job:  [id number]

## 2010-11-02 ENCOUNTER — Other Ambulatory Visit (INDEPENDENT_AMBULATORY_CARE_PROVIDER_SITE_OTHER): Payer: Medicare Other

## 2010-11-02 DIAGNOSIS — E0590 Thyrotoxicosis, unspecified without thyrotoxic crisis or storm: Secondary | ICD-10-CM

## 2010-11-02 LAB — T4, FREE: Free T4: 0.83 ng/dL (ref 0.60–1.60)

## 2010-11-02 LAB — T3, FREE: T3, Free: 2.6 pg/mL (ref 2.3–4.2)

## 2010-11-20 ENCOUNTER — Telehealth: Payer: Self-pay | Admitting: Family Medicine

## 2010-11-20 NOTE — Telephone Encounter (Signed)
Pt. Came in on the 25 th of June and received and thyroid test and never received results. Please contact patient with lad results.

## 2010-11-23 NOTE — Telephone Encounter (Signed)
patient  Is aware 

## 2010-11-23 NOTE — Telephone Encounter (Signed)
Please call lab report

## 2011-02-10 ENCOUNTER — Ambulatory Visit (INDEPENDENT_AMBULATORY_CARE_PROVIDER_SITE_OTHER): Payer: Medicare Other

## 2011-02-10 DIAGNOSIS — Z23 Encounter for immunization: Secondary | ICD-10-CM

## 2011-03-28 ENCOUNTER — Other Ambulatory Visit: Payer: Self-pay | Admitting: Family Medicine

## 2011-08-04 ENCOUNTER — Ambulatory Visit (INDEPENDENT_AMBULATORY_CARE_PROVIDER_SITE_OTHER): Payer: Medicare Other | Admitting: Family Medicine

## 2011-08-04 ENCOUNTER — Encounter: Payer: Self-pay | Admitting: Family Medicine

## 2011-08-04 VITALS — BP 118/80 | Temp 97.8°F | Ht 67.25 in | Wt 149.0 lb

## 2011-08-04 DIAGNOSIS — I428 Other cardiomyopathies: Secondary | ICD-10-CM

## 2011-08-04 DIAGNOSIS — I2510 Atherosclerotic heart disease of native coronary artery without angina pectoris: Secondary | ICD-10-CM

## 2011-08-04 DIAGNOSIS — I10 Essential (primary) hypertension: Secondary | ICD-10-CM

## 2011-08-04 DIAGNOSIS — E785 Hyperlipidemia, unspecified: Secondary | ICD-10-CM

## 2011-08-04 DIAGNOSIS — K219 Gastro-esophageal reflux disease without esophagitis: Secondary | ICD-10-CM

## 2011-08-04 LAB — CBC WITH DIFFERENTIAL/PLATELET
Basophils Absolute: 0 10*3/uL (ref 0.0–0.1)
Eosinophils Absolute: 0.1 10*3/uL (ref 0.0–0.7)
Lymphocytes Relative: 35.9 % (ref 12.0–46.0)
MCHC: 32.9 g/dL (ref 30.0–36.0)
MCV: 95 fl (ref 78.0–100.0)
Monocytes Absolute: 0.6 10*3/uL (ref 0.1–1.0)
Neutrophils Relative %: 53.1 % (ref 43.0–77.0)
Platelets: 157 10*3/uL (ref 150.0–400.0)
RDW: 14.9 % — ABNORMAL HIGH (ref 11.5–14.6)

## 2011-08-04 LAB — POCT URINALYSIS DIPSTICK
Glucose, UA: NEGATIVE
Nitrite, UA: NEGATIVE
Protein, UA: NEGATIVE
Spec Grav, UA: 1.015
Urobilinogen, UA: 0.2

## 2011-08-04 LAB — HEPATIC FUNCTION PANEL
ALT: 21 U/L (ref 0–53)
AST: 23 U/L (ref 0–37)
Alkaline Phosphatase: 78 U/L (ref 39–117)
Bilirubin, Direct: 0.2 mg/dL (ref 0.0–0.3)
Total Bilirubin: 0.8 mg/dL (ref 0.3–1.2)
Total Protein: 7.4 g/dL (ref 6.0–8.3)

## 2011-08-04 LAB — LIPID PANEL
Cholesterol: 111 mg/dL (ref 0–200)
VLDL: 14.2 mg/dL (ref 0.0–40.0)

## 2011-08-04 LAB — BASIC METABOLIC PANEL
CO2: 24 mEq/L (ref 19–32)
Calcium: 9.4 mg/dL (ref 8.4–10.5)
Creatinine, Ser: 1.4 mg/dL (ref 0.4–1.5)
GFR: 52.61 mL/min — ABNORMAL LOW (ref >60.00)
Glucose, Bld: 88 mg/dL (ref 70–99)
Sodium: 139 mEq/L (ref 135–145)

## 2011-08-04 LAB — TSH: TSH: 8.44 u[IU]/mL — ABNORMAL HIGH (ref 0.35–5.50)

## 2011-08-04 MED ORDER — NITROGLYCERIN 0.4 MG SL SUBL: 0.4000 mg | SUBLINGUAL_TABLET | SUBLINGUAL | Status: DC | PRN

## 2011-08-04 MED ORDER — CARVEDILOL 12.5 MG PO TABS: 12.5000 mg | ORAL_TABLET | Freq: Two times a day (BID) | ORAL | Status: DC

## 2011-08-04 MED ORDER — RANITIDINE HCL 150 MG PO CAPS: 150.0000 mg | ORAL_CAPSULE | Freq: Every evening | ORAL | Status: DC

## 2011-08-04 MED ORDER — CLOPIDOGREL BISULFATE 75 MG PO TABS: 75.0000 mg | ORAL_TABLET | Freq: Every day | ORAL | Status: DC

## 2011-08-04 MED ORDER — LISINOPRIL 2.5 MG PO TABS: 2.5000 mg | ORAL_TABLET | Freq: Two times a day (BID) | ORAL | Status: DC

## 2011-08-04 MED ORDER — ATORVASTATIN CALCIUM 80 MG PO TABS: 80.0000 mg | ORAL_TABLET | Freq: Every day | ORAL | Status: DC

## 2011-08-04 NOTE — Patient Instructions (Signed)
Continue your current medications  Followup in 1 year sooner if any problems 

## 2011-08-04 NOTE — Progress Notes (Signed)
  Subjective:    Patient ID: [name] [name], male    DOB: [birth year], 78 y.o.   MRN: [id number]  HPI [name] [name] is a 78 year old married male nonsmoker who comes in today for a Medicare wellness examination because of a history of hyperlipidemia, coronary disease, cardiomyopathy, reflux esophagitis  He states he feels well and has no complaints. He last saw Dr. Jens Som about a year ago. He feels cardiovascular-wise he stable no chest pain no shortness of breath. Medications reviewed there have been no changes except he is on carvedilol 12.5 mg twice a day  I check the dose and that seems to be correct  He gets routine eye care, hearing normal, regular dental care, no need for colonoscopy at age 78, flu shot 2012, Pneumovax x2, tetanus 2004, shingles 2007,  Cognitive function normal he walks on a regular basis activities of daily living normal, home health safety reviewed no issues identified, no guns in the house, he does have a health care power of attorney and living will.   Review of Systems  Constitutional: Negative.   HENT: Negative.   Eyes: Negative.   Respiratory: Negative.   Cardiovascular: Negative.   Gastrointestinal: Negative.   Genitourinary: Negative.   Musculoskeletal: Negative.   Skin: Negative.   Neurological: Negative.   Hematological: Negative.   Psychiatric/Behavioral: Negative.        Objective:   Physical Exam  Constitutional: He is oriented to person, place, and time. He appears well-developed and well-nourished.  HENT:  Head: Normocephalic and atraumatic.  Right Ear: External ear normal.  Left Ear: External ear normal.  Nose: Nose normal.  Mouth/Throat: Oropharynx is clear and moist.  Eyes: Conjunctivae and EOM are normal. Pupils are equal, round, and reactive to light.  Neck: Normal range of motion. Neck supple. No JVD present. No tracheal deviation present. No thyromegaly present.  Cardiovascular: Normal rate, regular rhythm, normal heart sounds and  intact distal pulses.  Exam reveals no gallop and no friction rub.   No murmur heard. Pulmonary/Chest: Effort normal and breath sounds normal. No stridor. No respiratory distress. He has no wheezes. He has no rales. He exhibits no tenderness.  Abdominal: Soft. Bowel sounds are normal. He exhibits no distension and no mass. There is no tenderness. There is no rebound and no guarding.  Musculoskeletal: Normal range of motion. He exhibits no edema and no tenderness.  Lymphadenopathy:    He has no cervical adenopathy.  Neurological: He is alert and oriented to person, place, and time. He has normal reflexes. No cranial nerve deficit. He exhibits normal muscle tone.  Skin: Skin is warm and dry. No rash noted. No erythema. No pallor.  Psychiatric: He has a normal mood and affect. His behavior is normal. Judgment and thought content normal.          Assessment & Plan:  Healthy male  Hyperlipidemia continue Lipitor 80 mg daily and an aspirin tablet check labs  Coronary disease continue Plavix  Hypertension continue lisinopril 2.5 mg twice a day and carvedilol 12.5 mg twice a day  Reflux esophagitis continue Zantac 150 mg twice a day  Chronic sun damage followup by dermatologist Dr. Terri Piedra yearly

## 2011-09-02 ENCOUNTER — Encounter: Payer: Self-pay | Admitting: Cardiovascular Disease

## 2011-09-02 ENCOUNTER — Encounter: Payer: Self-pay | Admitting: *Deleted

## 2011-09-03 ENCOUNTER — Ambulatory Visit (INDEPENDENT_AMBULATORY_CARE_PROVIDER_SITE_OTHER): Payer: Medicare Other | Admitting: Cardiology

## 2011-09-03 ENCOUNTER — Encounter: Payer: Self-pay | Admitting: Cardiology

## 2011-09-03 VITALS — BP 150/90 | HR 70 | Ht 68.0 in | Wt 151.0 lb

## 2011-09-03 DIAGNOSIS — E785 Hyperlipidemia, unspecified: Secondary | ICD-10-CM | POA: Diagnosis not present

## 2011-09-03 DIAGNOSIS — I10 Essential (primary) hypertension: Secondary | ICD-10-CM

## 2011-09-03 DIAGNOSIS — I428 Other cardiomyopathies: Secondary | ICD-10-CM

## 2011-09-03 NOTE — Assessment & Plan Note (Signed)
Blood pressure mildly elevated but he follows this closely at home and it is typically normal. Continue present medications. Potassium and renal function monitored by primary care.

## 2011-09-03 NOTE — Assessment & Plan Note (Signed)
Continue ACE inhibitor and beta blocker. Patient declines ICD. He understands the risk of sudden death.

## 2011-09-03 NOTE — Progress Notes (Signed)
HPI: [name] is a pleasant gentleman who has a history of coronary artery disease, ischemic cardiomyopathy, and hyperlipidemia.  He had a myocardial infarction in December 2004 complicated by ventricular fibrillation arrest.  He had PCI of his LAD at that time.  His most recent Myoview in March of 2013 showed an ejection fraction of 30%. There was a prior anterior and apical infarct, but there was no ischemia. He has declined ICD previously. Previous carotid Dopplers in July of 2006 showed 0-39% bilateral stenosis. Abdominal ultrasound at that time showed no aneurysm. I last saw him in Feb 2012. Since then the patient has dyspnea with more extreme activities but not with routine activities. It is relieved with rest. It is not associated with chest pain. There is no orthopnea, PND or pedal edema. There is no syncope or palpitations. There is no exertional chest pain.  Current Outpatient Prescriptions  Medication Sig Dispense Refill  . aspirin 325 MG EC tablet Take 325 mg by mouth daily.        Marland Kitchen atorvastatin (LIPITOR) 80 MG tablet Take 1 tablet (80 mg total) by mouth daily.  100 tablet  3  . azithromycin (AZASITE) 1 % ophthalmic solution Place 1 drop into both eyes 2 (two) times daily.        . carvedilol (COREG) 12.5 MG tablet Take 1 tablet (12.5 mg total) by mouth 2 (two) times daily with a meal.  200 tablet  1  . clobetasol (TEMOVATE) 0.05 % cream Apply topically 2 (two) times daily.        . clopidogrel (PLAVIX) 75 MG tablet Take 1 tablet (75 mg total) by mouth daily.  100 tablet  3  . lisinopril (PRINIVIL,ZESTRIL) 2.5 MG tablet Take 1 tablet (2.5 mg total) by mouth 2 (two) times daily at 10 AM and 5 PM.  200 tablet  3  . nitroGLYCERIN (NITROSTAT) 0.4 MG SL tablet Place 1 tablet (0.4 mg total) under the tongue every 5 (five) minutes as needed.  25 tablet  1  . NON FORMULARY Ultra flora plus  daily       . omeprazole (PRILOSEC) 20 MG capsule Take 20 mg by mouth daily.      . carvedilol  (COREG) 12.5 MG tablet Take 1 tablet (12.5 mg total) by mouth 2 (two) times daily with a meal.  180 tablet  3     Past Medical History  Diagnosis Date  . Hypertension   . CAD (coronary artery disease)   . Cardiomyopathy   . GERD (gastroesophageal reflux disease)   . UTI   . RESPIRATORY FAILURE   . HYPERLIPIDEMIA     Past Surgical History  Procedure Date  . Tonsillectomy   . Cardiac catheterization 04-2003    stents    History   Social History  . Marital Status: Married    Spouse Name: N/A    Number of Children: N/A  . Years of Education: N/A   Occupational History  . Not on file.   Social History Main Topics  . Smoking status: Former Smoker -- 1.0 packs/day for 30 years    Types: Cigarettes    Quit date: 07/27/1980  . Smokeless tobacco: Not on file  . Alcohol Use: Not on file  . Drug Use: Not on file  . Sexually Active: Not on file   Other Topics Concern  . Not on file   Social History Narrative  . No narrative on file    ROS: no fevers or  chills, productive cough, hemoptysis, dysphasia, odynophagia, melena, hematochezia, dysuria, hematuria, rash, seizure activity, orthopnea, PND, pedal edema, claudication. Remaining systems are negative.  Physical Exam: Well-developed well-nourished in no acute distress.  Skin is warm and dry.  HEENT is normal.  Neck is supple. No thyromegaly.  Chest is clear to auscultation with normal expansion.  Cardiovascular exam is regular rate and rhythm.  Abdominal exam nontender or distended. No masses palpated. Extremities show no edema. neuro grossly intact

## 2011-09-03 NOTE — Patient Instructions (Addendum)
Your physician wants you to follow-up in: ONE YEAR WITH DR CRENSHAW You will receive a reminder letter in the mail two months in advance. If you don't receive a letter, please call our office to schedule the follow-up appointment.   STOP PLAVIX 

## 2011-09-03 NOTE — Assessment & Plan Note (Signed)
Continue statin. Lipids and liver monitored by primary care. 

## 2011-09-03 NOTE — Assessment & Plan Note (Signed)
Continue aspirin and statin. Discontinue Plavix. 

## 2011-09-05 ENCOUNTER — Other Ambulatory Visit: Payer: Self-pay | Admitting: Family Medicine

## 2011-10-16 ENCOUNTER — Other Ambulatory Visit: Payer: Self-pay | Admitting: Family Medicine

## 2012-01-06 DIAGNOSIS — H02109 Unspecified ectropion of unspecified eye, unspecified eyelid: Secondary | ICD-10-CM | POA: Diagnosis not present

## 2012-01-06 DIAGNOSIS — H0019 Chalazion unspecified eye, unspecified eyelid: Secondary | ICD-10-CM | POA: Diagnosis not present

## 2012-01-17 DIAGNOSIS — H02109 Unspecified ectropion of unspecified eye, unspecified eyelid: Secondary | ICD-10-CM | POA: Diagnosis not present

## 2012-01-28 ENCOUNTER — Ambulatory Visit (INDEPENDENT_AMBULATORY_CARE_PROVIDER_SITE_OTHER): Payer: Medicare Other

## 2012-01-28 DIAGNOSIS — Z23 Encounter for immunization: Secondary | ICD-10-CM | POA: Diagnosis not present

## 2012-03-29 ENCOUNTER — Ambulatory Visit (INDEPENDENT_AMBULATORY_CARE_PROVIDER_SITE_OTHER): Payer: BC Managed Care – PPO | Admitting: Family Medicine

## 2012-03-29 VITALS — BP 126/82 | HR 86 | Temp 98.2°F | Resp 17 | Ht 67.5 in | Wt 150.0 lb

## 2012-03-29 DIAGNOSIS — J40 Bronchitis, not specified as acute or chronic: Secondary | ICD-10-CM

## 2012-03-29 DIAGNOSIS — L989 Disorder of the skin and subcutaneous tissue, unspecified: Secondary | ICD-10-CM

## 2012-03-29 MED ORDER — AZITHROMYCIN 250 MG PO TABS: ORAL_TABLET | ORAL | Status: DC

## 2012-03-29 NOTE — Progress Notes (Signed)
78 yo retired [employer], married, with one week of occasionally productive cough with sore throat  No vomiting, no shortness of breath   Objective:  NAD Throat:  Red Eyes:  Ectropion left lower lid Skin: actinic keratosis right cheek Chest: few ronchi, no rales  Assessment:  Bronchitis, ectropion, actinic keratosis  Plan:   1. Bronchitis  azithromycin (ZITHROMAX Z-PAK) 250 MG tablet

## 2012-04-10 DIAGNOSIS — D0439 Carcinoma in situ of skin of other parts of face: Secondary | ICD-10-CM | POA: Diagnosis not present

## 2012-04-10 DIAGNOSIS — D485 Neoplasm of uncertain behavior of skin: Secondary | ICD-10-CM | POA: Diagnosis not present

## 2012-04-10 DIAGNOSIS — L821 Other seborrheic keratosis: Secondary | ICD-10-CM | POA: Diagnosis not present

## 2012-04-10 DIAGNOSIS — L219 Seborrheic dermatitis, unspecified: Secondary | ICD-10-CM | POA: Diagnosis not present

## 2012-04-25 DIAGNOSIS — D0439 Carcinoma in situ of skin of other parts of face: Secondary | ICD-10-CM | POA: Diagnosis not present

## 2012-05-12 DIAGNOSIS — H2510 Age-related nuclear cataract, unspecified eye: Secondary | ICD-10-CM | POA: Diagnosis not present

## 2012-05-12 DIAGNOSIS — H25019 Cortical age-related cataract, unspecified eye: Secondary | ICD-10-CM | POA: Diagnosis not present

## 2012-05-12 DIAGNOSIS — H02109 Unspecified ectropion of unspecified eye, unspecified eyelid: Secondary | ICD-10-CM | POA: Diagnosis not present

## 2012-05-12 DIAGNOSIS — H52209 Unspecified astigmatism, unspecified eye: Secondary | ICD-10-CM | POA: Diagnosis not present

## 2012-07-31 ENCOUNTER — Other Ambulatory Visit: Payer: Self-pay | Admitting: Family Medicine

## 2012-08-15 ENCOUNTER — Encounter: Payer: Self-pay | Admitting: Family Medicine

## 2012-08-15 ENCOUNTER — Ambulatory Visit (INDEPENDENT_AMBULATORY_CARE_PROVIDER_SITE_OTHER): Payer: Medicare Other | Admitting: Family Medicine

## 2012-08-15 VITALS — BP 110/80 | Temp 98.3°F | Ht 68.25 in | Wt 154.0 lb

## 2012-08-15 DIAGNOSIS — I10 Essential (primary) hypertension: Secondary | ICD-10-CM

## 2012-08-15 DIAGNOSIS — Z0000 Encounter for general adult medical examination without abnormal findings: Secondary | ICD-10-CM

## 2012-08-15 DIAGNOSIS — L240 Irritant contact dermatitis due to detergents: Secondary | ICD-10-CM

## 2012-08-15 DIAGNOSIS — I2510 Atherosclerotic heart disease of native coronary artery without angina pectoris: Secondary | ICD-10-CM

## 2012-08-15 DIAGNOSIS — E785 Hyperlipidemia, unspecified: Secondary | ICD-10-CM

## 2012-08-15 DIAGNOSIS — K219 Gastro-esophageal reflux disease without esophagitis: Secondary | ICD-10-CM

## 2012-08-15 DIAGNOSIS — Z23 Encounter for immunization: Secondary | ICD-10-CM | POA: Diagnosis not present

## 2012-08-15 DIAGNOSIS — I428 Other cardiomyopathies: Secondary | ICD-10-CM

## 2012-08-15 LAB — BASIC METABOLIC PANEL
CO2: 27 mEq/L (ref 19–32)
Calcium: 9.2 mg/dL (ref 8.4–10.5)
Chloride: 106 mEq/L (ref 96–112)
Creatinine, Ser: 1.4 mg/dL (ref 0.4–1.5)
Glucose, Bld: 90 mg/dL (ref 70–99)

## 2012-08-15 LAB — CBC WITH DIFFERENTIAL/PLATELET
Eosinophils Relative: 1.6 % (ref 0.0–5.0)
MCV: 92.7 fl (ref 78.0–100.0)
Monocytes Absolute: 0.5 10*3/uL (ref 0.1–1.0)
Neutrophils Relative %: 63.3 % (ref 43.0–77.0)
Platelets: 158 10*3/uL (ref 150.0–400.0)
WBC: 6.4 10*3/uL (ref 4.5–10.5)

## 2012-08-15 LAB — HEPATIC FUNCTION PANEL
ALT: 24 U/L (ref 0–53)
AST: 22 U/L (ref 0–37)
Albumin: 4.1 g/dL (ref 3.5–5.2)
Total Protein: 7.4 g/dL (ref 6.0–8.3)

## 2012-08-15 LAB — POCT URINALYSIS DIPSTICK
Ketones, UA: NEGATIVE
Leukocytes, UA: NEGATIVE
Urobilinogen, UA: 0.2

## 2012-08-15 LAB — LIPID PANEL
Cholesterol: 113 mg/dL (ref 0–200)
HDL: 46.4 mg/dL (ref >39.00)
Triglycerides: 75 mg/dL (ref 0.0–149.0)

## 2012-08-15 MED ORDER — LISINOPRIL 2.5 MG PO TABS: ORAL_TABLET | ORAL | Status: DC

## 2012-08-15 MED ORDER — CLOBETASOL PROPIONATE 0.05 % EX CREA: TOPICAL_CREAM | Freq: Two times a day (BID) | CUTANEOUS | Status: DC

## 2012-08-15 MED ORDER — ATORVASTATIN CALCIUM 80 MG PO TABS: ORAL_TABLET | ORAL | Status: DC

## 2012-08-15 MED ORDER — NITROGLYCERIN 0.4 MG SL SUBL: 0.4000 mg | SUBLINGUAL_TABLET | SUBLINGUAL | Status: DC | PRN

## 2012-08-15 MED ORDER — CARVEDILOL 12.5 MG PO TABS: 12.5000 mg | ORAL_TABLET | Freq: Two times a day (BID) | ORAL | Status: DC

## 2012-08-15 NOTE — Progress Notes (Signed)
  Subjective:    Patient ID: [name] [name], male    DOB: 1931-05-12, 79 y.o.   MRN: [id number]  HPI [name] is a 79 year old married male nonsmoker who comes in today for a Medicare wellness examination because of a history of underlying coronary disease, hyperlipidemia, cardiomyopathy, reflux esophagitis  He's followed by the ophthalmologist on a regular basis he has a recent problem with inversion of his left lower eyelid.  He gets routine eye care, dental care, at 33 and no previous history of colon polyps or cancer he can age out of colonoscopies.  Vaccination reviewed up-to-date except we will give him a tetanus booster today  Cognitive function normal he walks on a regular basis home health safety reviewed no issues identified, no guns in the house, he does have a health care power of attorney and living well  He's due to see Dr. Jens Som on the 29th of this month for cardiac followup   Review of Systems  Constitutional: Negative.   HENT: Negative.   Eyes: Negative.   Respiratory: Negative.   Cardiovascular: Negative.   Gastrointestinal: Negative.   Genitourinary: Negative.   Musculoskeletal: Negative.   Skin: Negative.   Neurological: Negative.   Psychiatric/Behavioral: Negative.        Objective:   Physical Exam  Constitutional: He is oriented to person, place, and time. He appears well-developed and well-nourished.  HENT:  Head: Normocephalic and atraumatic.  Right Ear: External ear normal.  Left Ear: External ear normal.  Nose: Nose normal.  Mouth/Throat: Oropharynx is clear and moist.  Eyes: Conjunctivae and EOM are normal. Pupils are equal, round, and reactive to light. Right eye exhibits no discharge. Left eye exhibits no discharge. No scleral icterus.  Introversion left lower eyelid  Neck: Normal range of motion. Neck supple. No JVD present. No tracheal deviation present. No thyromegaly present.  Cardiovascular: Normal rate, regular rhythm, normal heart sounds  and intact distal pulses.  Exam reveals no gallop and no friction rub.   No murmur heard. No carotid nor aortic bruits peripheral pulses 2+ and regular and symmetrical  Pulmonary/Chest: Effort normal and breath sounds normal. No stridor. No respiratory distress. He has no wheezes. He has no rales. He exhibits no tenderness.  Abdominal: Soft. Bowel sounds are normal. He exhibits no distension and no mass. There is no tenderness. There is no rebound and no guarding.  Genitourinary: Rectum normal, prostate normal and penis normal. Guaiac negative stool. No penile tenderness.  Musculoskeletal: Normal range of motion. He exhibits no edema and no tenderness.  Lymphadenopathy:    He has no cervical adenopathy.  Neurological: He is alert and oriented to person, place, and time. He has normal reflexes. No cranial nerve deficit. He exhibits normal muscle tone.  Skin: Skin is warm and dry. No rash noted. No erythema. No pallor.  Scar right cheek from recent squamous cell carcinoma rate moved by a dermatologist. Scars in the scalp from previous infection otherwise no obvious abnormal lesions  Psychiatric: He has a normal mood and affect. His behavior is normal. Judgment and thought content normal.          Assessment & Plan:  Coronary disease asymptomatic continue current therapy  Hypertension at goal BP 110/80 continue current therapy  Hyperlipidemia continue Lipitor and aspirin  Reflux esophagitis continue Prilosec 20 mg daily  Tetanus booster to update vaccinations today

## 2012-08-15 NOTE — Patient Instructions (Signed)
Continue your current medications diet and exercise program  Followup in 1 year sooner if any problems

## 2012-09-05 ENCOUNTER — Ambulatory Visit (INDEPENDENT_AMBULATORY_CARE_PROVIDER_SITE_OTHER): Payer: Medicare Other | Admitting: Cardiology

## 2012-09-05 ENCOUNTER — Encounter: Payer: Self-pay | Admitting: Cardiology

## 2012-09-05 VITALS — BP 115/70 | HR 69 | Ht 70.5 in | Wt 154.0 lb

## 2012-09-05 DIAGNOSIS — I10 Essential (primary) hypertension: Secondary | ICD-10-CM | POA: Diagnosis not present

## 2012-09-05 DIAGNOSIS — Z01810 Encounter for preprocedural cardiovascular examination: Secondary | ICD-10-CM | POA: Insufficient documentation

## 2012-09-05 DIAGNOSIS — I255 Ischemic cardiomyopathy: Secondary | ICD-10-CM

## 2012-09-05 DIAGNOSIS — I2589 Other forms of chronic ischemic heart disease: Secondary | ICD-10-CM | POA: Diagnosis not present

## 2012-09-05 NOTE — Patient Instructions (Addendum)
Your physician wants you to follow-up in: ONE YEAR WITH DR CRENSHAW You will receive a reminder letter in the mail two months in advance. If you don't receive a letter, please call our office to schedule the follow-up appointment.  

## 2012-09-05 NOTE — Assessment & Plan Note (Signed)
Continue present blood pressure medications. 

## 2012-09-05 NOTE — Assessment & Plan Note (Signed)
The patient will need surgery on his left eyelid as well as cataract surgery. His most recent functional study showed no ischemia and he is asymptomatic. Patient may proceed without further cardiac evaluation.

## 2012-09-05 NOTE — Assessment & Plan Note (Signed)
Continue aspirin and statin. 

## 2012-09-05 NOTE — Assessment & Plan Note (Signed)
Continue ACE inhibitor and beta blocker. Patient again declines ICD and understands the risk of sudden death.

## 2012-09-05 NOTE — Assessment & Plan Note (Signed)
Continue statin. 

## 2012-09-05 NOTE — Progress Notes (Signed)
HPI: [name] is a pleasant gentleman who has a history of coronary artery disease, ischemic cardiomyopathy, and hyperlipidemia. He had a myocardial infarction in December 2004 complicated by ventricular fibrillation arrest. He had PCI of his LAD at that time. His most recent Myoview in March of 2013 showed an ejection fraction of 30%. There was a prior anterior and apical infarct, but there was no ischemia. He has declined ICD previously. Previous carotid Dopplers in July of 2006 showed 0-39% bilateral stenosis. Abdominal ultrasound at that time showed no aneurysm. I last saw him in April of 2013. Since then the patient has dyspnea with more extreme activities but not with routine activities. It is relieved with rest. It is not associated with chest pain. There is no orthopnea, PND or pedal edema. There is no syncope or palpitations. There is no exertional chest pain.   Current Outpatient Prescriptions  Medication Sig Dispense Refill  . aspirin 325 MG EC tablet Take 325 mg by mouth daily.        Marland Kitchen atorvastatin (LIPITOR) 80 MG tablet TAKE 1 TABLET BY MOUTH EVERY DAY  100 tablet  3  . carvedilol (COREG) 12.5 MG tablet Take 1 tablet (12.5 mg total) by mouth 2 (two) times daily with a meal.  200 tablet  3  . clobetasol cream (TEMOVATE) 0.05 % Apply 1 application topically 2 (two) times daily as needed.      Marland Kitchen lisinopril (PRINIVIL,ZESTRIL) 2.5 MG tablet TAKE 1 TABLET (2.5 MG TOTAL) BY MOUTH 2 (TWO) TIMES DAILY AT 10 AM AND 5 PM.  180 tablet  3  . nitroGLYCERIN (NITROSTAT) 0.4 MG SL tablet Place 1 tablet (0.4 mg total) under the tongue every 5 (five) minutes as needed.  25 tablet  1  . NON FORMULARY Ultra flora plus  daily       . ranitidine (ZANTAC) 150 MG capsule Take 150 mg by mouth every evening.        No current facility-administered medications for this visit.     Past Medical History  Diagnosis Date  . Hypertension   . CAD (coronary artery disease)   . Cardiomyopathy   . GERD  (gastroesophageal reflux disease)   . UTI   . RESPIRATORY FAILURE   . HYPERLIPIDEMIA   . Allergy   . Myocardial infarction     Past Surgical History  Procedure Laterality Date  . Tonsillectomy    . Cardiac catheterization  04-2003    stents    History   Social History  . Marital Status: Married    Spouse Name: N/A    Number of Children: N/A  . Years of Education: N/A   Occupational History  . Not on file.   Social History Main Topics  . Smoking status: Former Smoker -- 1.00 packs/day for 30 years    Types: Cigarettes    Quit date: 07/27/1980  . Smokeless tobacco: Not on file  . Alcohol Use: No  . Drug Use: No  . Sexually Active: Yes    Birth Control/ Protection: None   Other Topics Concern  . Not on file   Social History Narrative  . No narrative on file    ROS: no fevers or chills, productive cough, hemoptysis, dysphasia, odynophagia, melena, hematochezia, dysuria, hematuria, rash, seizure activity, orthopnea, PND, pedal edema, claudication. Remaining systems are negative.  Physical Exam: Well-developed well-nourished in no acute distress.  Skin is warm and dry.  HEENT is normal.  Neck is supple.  Chest is clear  to auscultation with normal expansion.  Cardiovascular exam is regular rate and rhythm.  Abdominal exam nontender or distended. No masses palpated. Extremities show no edema. neuro grossly intact  ECG sinus rhythm at a rate of 69. Occasional PVC. Prior septal infarct. Nonspecific ST changes.

## 2012-09-12 ENCOUNTER — Other Ambulatory Visit: Payer: Self-pay | Admitting: Family Medicine

## 2013-02-14 ENCOUNTER — Ambulatory Visit (INDEPENDENT_AMBULATORY_CARE_PROVIDER_SITE_OTHER): Payer: Medicare Other

## 2013-02-14 DIAGNOSIS — Z23 Encounter for immunization: Secondary | ICD-10-CM

## 2013-03-26 DIAGNOSIS — H2510 Age-related nuclear cataract, unspecified eye: Secondary | ICD-10-CM | POA: Diagnosis not present

## 2013-03-26 DIAGNOSIS — H02109 Unspecified ectropion of unspecified eye, unspecified eyelid: Secondary | ICD-10-CM | POA: Diagnosis not present

## 2013-04-02 DIAGNOSIS — H02139 Senile ectropion of unspecified eye, unspecified eyelid: Secondary | ICD-10-CM | POA: Diagnosis not present

## 2013-04-02 DIAGNOSIS — H11439 Conjunctival hyperemia, unspecified eye: Secondary | ICD-10-CM | POA: Diagnosis not present

## 2013-04-02 DIAGNOSIS — L908 Other atrophic disorders of skin: Secondary | ICD-10-CM | POA: Diagnosis not present

## 2013-04-02 DIAGNOSIS — M2420 Disorder of ligament, unspecified site: Secondary | ICD-10-CM | POA: Diagnosis not present

## 2013-04-16 ENCOUNTER — Other Ambulatory Visit: Payer: Self-pay | Admitting: Dermatology

## 2013-04-16 DIAGNOSIS — C44319 Basal cell carcinoma of skin of other parts of face: Secondary | ICD-10-CM | POA: Diagnosis not present

## 2013-05-25 ENCOUNTER — Other Ambulatory Visit: Payer: Self-pay | Admitting: Family Medicine

## 2013-05-31 ENCOUNTER — Encounter: Payer: Self-pay | Admitting: Cardiology

## 2013-05-31 ENCOUNTER — Ambulatory Visit (INDEPENDENT_AMBULATORY_CARE_PROVIDER_SITE_OTHER): Payer: Medicare Other | Admitting: Cardiology

## 2013-05-31 VITALS — BP 158/68 | HR 65 | Wt 153.0 lb

## 2013-05-31 DIAGNOSIS — Z01810 Encounter for preprocedural cardiovascular examination: Secondary | ICD-10-CM

## 2013-05-31 DIAGNOSIS — I10 Essential (primary) hypertension: Secondary | ICD-10-CM | POA: Diagnosis not present

## 2013-05-31 DIAGNOSIS — I2510 Atherosclerotic heart disease of native coronary artery without angina pectoris: Secondary | ICD-10-CM | POA: Diagnosis not present

## 2013-05-31 DIAGNOSIS — E785 Hyperlipidemia, unspecified: Secondary | ICD-10-CM

## 2013-05-31 DIAGNOSIS — I428 Other cardiomyopathies: Secondary | ICD-10-CM | POA: Diagnosis not present

## 2013-05-31 NOTE — Assessment & Plan Note (Signed)
No symptoms of chest pain with good functional capacity. Previous nuclear study but no ischemia. Patient may proceed with surgery without further cardiac evaluation.

## 2013-05-31 NOTE — Progress Notes (Signed)
HPI: [name] [name] is a pleasant gentleman who has a history of coronary artery disease, ischemic cardiomyopathy, and hyperlipidemia. He had a myocardial infarction in December 2703 complicated by ventricular fibrillation arrest. He had PCI of his LAD at that time. His most recent Myoview in March of 2013 showed an ejection fraction of 30%. There was a prior anterior and apical infarct, but there was no ischemia. He has declined ICD previously. Previous carotid Dopplers in July of 2006 showed 0-39% bilateral stenosis. Abdominal ultrasound at that time showed no aneurysm. I last saw him in April of 2014. Since then the patient denies any dyspnea on exertion, orthopnea, PND, pedal edema, palpitations, syncope or chest pain. Patient will require surgery on his left eye and we were asked to evaluate preoperatively. Note he walks an hour a day with no chest pain.    Current Outpatient Prescriptions  Medication Sig Dispense Refill  . aspirin 325 MG EC tablet Take 325 mg by mouth daily.        Marland Kitchen atorvastatin (LIPITOR) 80 MG tablet TAKE 1 TABLET BY MOUTH EVERY DAY  100 tablet  3  . carvedilol (COREG) 12.5 MG tablet Take 1 tablet (12.5 mg total) by mouth 2 (two) times daily with a meal.  200 tablet  3  . clobetasol cream (TEMOVATE) 5.00 % Apply 1 application topically 2 (two) times daily as needed.      Marland Kitchen lisinopril (PRINIVIL,ZESTRIL) 2.5 MG tablet TAKE 1 TABLET (2.5 MG TOTAL) BY MOUTH 2 (TWO) TIMES DAILY AT 10 AM AND 5 PM.  180 tablet  3  . nitroGLYCERIN (NITROSTAT) 0.4 MG SL tablet Place 1 tablet (0.4 mg total) under the tongue every 5 (five) minutes as needed.  25 tablet  1  . NON FORMULARY Ultra flora plus  daily       . ranitidine (ZANTAC) 150 MG capsule TAKE ONE CAPSULE BY MOUTH EVERY EVENING  100 capsule  2   No current facility-administered medications for this visit.     Past Medical History  Diagnosis Date  . Hypertension   . CAD (coronary artery disease)   . Cardiomyopathy   . GERD  (gastroesophageal reflux disease)   . UTI   . RESPIRATORY FAILURE   . HYPERLIPIDEMIA   . Allergy   . Myocardial infarction     Past Surgical History  Procedure Laterality Date  . Tonsillectomy    . Cardiac catheterization  04-2003    stents    History   Social History  . Marital Status: Married    Spouse Name: N/A    Number of Children: N/A  . Years of Education: N/A   Occupational History  . Not on file.   Social History Main Topics  . Smoking status: Former Smoker -- 1.00 packs/day for 30 years    Types: Cigarettes    Quit date: 07/27/1980  . Smokeless tobacco: Not on file  . Alcohol Use: No  . Drug Use: No  . Sexual Activity: Yes    Birth Control/ Protection: None   Other Topics Concern  . Not on file   Social History Narrative  . No narrative on file    ROS: no fevers or chills, productive cough, hemoptysis, dysphasia, odynophagia, melena, hematochezia, dysuria, hematuria, rash, seizure activity, orthopnea, PND, pedal edema, claudication. Remaining systems are negative.  Physical Exam: Well-developed well-nourished in no acute distress.  Skin is warm and dry.  HEENT is normal.  Neck is supple.  Chest is  clear to auscultation with normal expansion.  Cardiovascular exam is regular rate and rhythm.  Abdominal exam nontender or distended. No masses palpated. Extremities show no edema. neuro grossly intact  ECG sinus rhythm at a rate of 65. Prior lateral infarct, nonspecific ST changes.

## 2013-05-31 NOTE — Assessment & Plan Note (Signed)
Continue aspirin and statin. 

## 2013-05-31 NOTE — Assessment & Plan Note (Signed)
Continue statin. 

## 2013-05-31 NOTE — Assessment & Plan Note (Signed)
Blood pressure mildly elevated but typically controlled. Continue present medications and follow.

## 2013-05-31 NOTE — Patient Instructions (Signed)
Your physician wants you to follow-up in: ONE YEAR WITH DR CRENSHAW You will receive a reminder letter in the mail two months in advance. If you don't receive a letter, please call our office to schedule the follow-up appointment.  

## 2013-05-31 NOTE — Assessment & Plan Note (Signed)
Continue ACE inhibitor and beta blocker. Previously declined ICD.

## 2013-06-01 ENCOUNTER — Telehealth: Payer: Self-pay | Admitting: Cardiology

## 2013-06-01 NOTE — Telephone Encounter (Signed)
Received request from Nurse fax box, documents faxed for surgical clearance. [id number]  Fax number: [phone] Attention: [id number]/[phone]

## 2013-06-14 ENCOUNTER — Telehealth: Payer: Self-pay | Admitting: Cardiology

## 2013-06-14 NOTE — Telephone Encounter (Signed)
Spoke with pt wife, aware we would like the pt to stay on aspirin.

## 2013-06-14 NOTE — Telephone Encounter (Signed)
New message     Pt is due to have eyelid tuck surgery Monday---should he stop asa?

## 2013-06-18 DIAGNOSIS — H02119 Cicatricial ectropion of unspecified eye, unspecified eyelid: Secondary | ICD-10-CM | POA: Diagnosis not present

## 2013-06-18 DIAGNOSIS — L905 Scar conditions and fibrosis of skin: Secondary | ICD-10-CM | POA: Diagnosis not present

## 2013-06-18 DIAGNOSIS — H02139 Senile ectropion of unspecified eye, unspecified eyelid: Secondary | ICD-10-CM | POA: Diagnosis not present

## 2013-07-04 ENCOUNTER — Other Ambulatory Visit: Payer: Self-pay | Admitting: Family Medicine

## 2013-07-12 DIAGNOSIS — H02109 Unspecified ectropion of unspecified eye, unspecified eyelid: Secondary | ICD-10-CM | POA: Diagnosis not present

## 2013-07-12 DIAGNOSIS — H2510 Age-related nuclear cataract, unspecified eye: Secondary | ICD-10-CM | POA: Diagnosis not present

## 2013-08-18 ENCOUNTER — Other Ambulatory Visit: Payer: Self-pay | Admitting: Family Medicine

## 2013-08-29 ENCOUNTER — Encounter: Payer: Self-pay | Admitting: Family Medicine

## 2013-08-29 ENCOUNTER — Ambulatory Visit (INDEPENDENT_AMBULATORY_CARE_PROVIDER_SITE_OTHER): Payer: Medicare Other | Admitting: Family Medicine

## 2013-08-29 ENCOUNTER — Telehealth: Payer: Self-pay | Admitting: Family Medicine

## 2013-08-29 VITALS — BP 140/90 | Temp 98.4°F | Ht 67.75 in | Wt 149.0 lb

## 2013-08-29 DIAGNOSIS — Z23 Encounter for immunization: Secondary | ICD-10-CM

## 2013-08-29 DIAGNOSIS — N401 Enlarged prostate with lower urinary tract symptoms: Secondary | ICD-10-CM

## 2013-08-29 DIAGNOSIS — I10 Essential (primary) hypertension: Secondary | ICD-10-CM | POA: Diagnosis not present

## 2013-08-29 DIAGNOSIS — K219 Gastro-esophageal reflux disease without esophagitis: Secondary | ICD-10-CM

## 2013-08-29 DIAGNOSIS — E785 Hyperlipidemia, unspecified: Secondary | ICD-10-CM | POA: Diagnosis not present

## 2013-08-29 DIAGNOSIS — R351 Nocturia: Secondary | ICD-10-CM

## 2013-08-29 DIAGNOSIS — I2510 Atherosclerotic heart disease of native coronary artery without angina pectoris: Secondary | ICD-10-CM

## 2013-08-29 DIAGNOSIS — N138 Other obstructive and reflux uropathy: Secondary | ICD-10-CM | POA: Diagnosis not present

## 2013-08-29 DIAGNOSIS — I428 Other cardiomyopathies: Secondary | ICD-10-CM

## 2013-08-29 LAB — POCT URINALYSIS DIPSTICK
Bilirubin, UA: NEGATIVE
Blood, UA: NEGATIVE
GLUCOSE UA: NEGATIVE
Ketones, UA: NEGATIVE
Leukocytes, UA: NEGATIVE
NITRITE UA: NEGATIVE
Protein, UA: NEGATIVE
Spec Grav, UA: 1.02
UROBILINOGEN UA: 0.2
pH, UA: 5.5

## 2013-08-29 LAB — CBC WITH DIFFERENTIAL/PLATELET
BASOS ABS: 0 10*3/uL (ref 0.0–0.1)
BASOS PCT: 0.7 % (ref 0.0–3.0)
EOS ABS: 0.1 10*3/uL (ref 0.0–0.7)
Eosinophils Relative: 1.4 % (ref 0.0–5.0)
HCT: 44.8 % (ref 39.0–52.0)
Hemoglobin: 14.7 g/dL (ref 13.0–17.0)
LYMPHS PCT: 27.9 % (ref 12.0–46.0)
Lymphs Abs: 1.8 10*3/uL (ref 0.7–4.0)
MCHC: 32.9 g/dL (ref 30.0–36.0)
MCV: 94.7 fl (ref 78.0–100.0)
Monocytes Absolute: 0.5 10*3/uL (ref 0.1–1.0)
Monocytes Relative: 7 % (ref 3.0–12.0)
Neutro Abs: 4.1 10*3/uL (ref 1.4–7.7)
Neutrophils Relative %: 63 % (ref 43.0–77.0)
Platelets: 226 10*3/uL (ref 150.0–400.0)
RBC: 4.73 Mil/uL (ref 4.22–5.81)
RDW: 14.8 % — AB (ref 11.5–14.6)
WBC: 6.4 10*3/uL (ref 4.5–10.5)

## 2013-08-29 LAB — HEPATIC FUNCTION PANEL
ALBUMIN: 3.7 g/dL (ref 3.5–5.2)
ALK PHOS: 68 U/L (ref 39–117)
ALT: 19 U/L (ref 0–53)
AST: 21 U/L (ref 0–37)
Bilirubin, Direct: 0.1 mg/dL (ref 0.0–0.3)
TOTAL PROTEIN: 6.7 g/dL (ref 6.0–8.3)
Total Bilirubin: 0.8 mg/dL (ref 0.3–1.2)

## 2013-08-29 LAB — LIPID PANEL
CHOLESTEROL: 101 mg/dL (ref 0–200)
HDL: 41.8 mg/dL (ref >39.00)
LDL CALC: 49 mg/dL (ref 0–99)
Total CHOL/HDL Ratio: 2
Triglycerides: 52 mg/dL (ref 0.0–149.0)
VLDL: 10.4 mg/dL (ref 0.0–40.0)

## 2013-08-29 LAB — BASIC METABOLIC PANEL
BUN: 24 mg/dL — ABNORMAL HIGH (ref 6–23)
CALCIUM: 9.4 mg/dL (ref 8.4–10.5)
CO2: 28 mEq/L (ref 19–32)
Chloride: 106 mEq/L (ref 96–112)
Creatinine, Ser: 1.4 mg/dL (ref 0.4–1.5)
GFR: 53.66 mL/min — AB (ref >60.00)
Glucose, Bld: 88 mg/dL (ref 70–99)
Potassium: 4.5 mEq/L (ref 3.5–5.1)
SODIUM: 140 meq/L (ref 135–145)

## 2013-08-29 LAB — TSH: TSH: 4.86 u[IU]/mL (ref 0.35–5.50)

## 2013-08-29 LAB — PSA: PSA: 1.07 ng/mL (ref 0.10–4.00)

## 2013-08-29 MED ORDER — ATORVASTATIN CALCIUM 80 MG PO TABS: ORAL_TABLET | ORAL | Status: DC

## 2013-08-29 MED ORDER — NITROGLYCERIN 0.4 MG SL SUBL: 0.4000 mg | SUBLINGUAL_TABLET | SUBLINGUAL | Status: AC | PRN

## 2013-08-29 MED ORDER — RANITIDINE HCL 150 MG PO CAPS: ORAL_CAPSULE | ORAL | Status: DC

## 2013-08-29 MED ORDER — CARVEDILOL 12.5 MG PO TABS: ORAL_TABLET | ORAL | Status: DC

## 2013-08-29 MED ORDER — LISINOPRIL 2.5 MG PO TABS: ORAL_TABLET | ORAL | Status: DC

## 2013-08-29 NOTE — Progress Notes (Signed)
   Subjective:    Patient ID: [name] [name], male    DOB: 1931/09/20, 80 y.o.   MRN: [id number]  HPI Vanessa is a 80 year old married male nonsmoker who comes in today for a Medicare wellness examination because of a history of hyperlipidemia, coronary artery disease, cardiomyopathy, hypertension  He also has reflux esophagitis and takes ENTec 1 daily  Med list reviewed there've been no changes  Cognitive function normal he does not walk on a daily basis home health safety reviewed no issues identified, no guns in the house, he does have a health care power of attorney and a living well  Vaccinations updated by [facility]  Cardiac evaluation by Dr. Stanford Breed within normal limits for his problems..   Review of Systems  Constitutional: Negative.   HENT: Negative.   Eyes: Negative.   Respiratory: Negative.   Cardiovascular: Negative.   Gastrointestinal: Negative.   Genitourinary: Negative.   Musculoskeletal: Negative.   Skin: Negative.   Neurological: Negative.   Psychiatric/Behavioral: Negative.        Objective:   Physical Exam  Nursing note and vitals reviewed. Constitutional: He is oriented to person, place, and time. He appears well-developed and well-nourished.  HENT:  Head: Normocephalic and atraumatic.  Right Ear: External ear normal.  Left Ear: External ear normal.  Nose: Nose normal.  Mouth/Throat: Oropharynx is clear and moist.  Eyes: Conjunctivae and EOM are normal. Pupils are equal, round, and reactive to light.  Neck: Normal range of motion. Neck supple. No JVD present. No tracheal deviation present. No thyromegaly present.  Cardiovascular: Normal rate, regular rhythm, normal heart sounds and intact distal pulses.  Exam reveals no gallop and no friction rub.   No murmur heard. No carotid nor aortic bruits peripheral pulses 1+ and symmetrical  Pulmonary/Chest: Effort normal and breath sounds normal. No stridor. No respiratory distress. He has no wheezes. He has no  rales. He exhibits no tenderness.  Abdominal: Soft. Bowel sounds are normal. He exhibits no distension and no mass. There is no tenderness. There is no rebound and no guarding.  Genitourinary: Rectum normal and penis normal. Guaiac negative stool. No penile tenderness.  3+ symmetrical BPH  Musculoskeletal: Normal range of motion. He exhibits no edema and no tenderness.  Lymphadenopathy:    He has no cervical adenopathy.  Neurological: He is alert and oriented to person, place, and time. He has normal reflexes. No cranial nerve deficit. He exhibits normal muscle tone.  Skin: Skin is warm and dry. No rash noted. No erythema. No pallor.  Psychiatric: He has a normal mood and affect. His behavior is normal. Judgment and thought content normal.          Assessment & Plan:  Hyperlipidemia continue Lipitor check labs  Cardiomyopathy continue current meds  Coronary disease asymptomatic refill nitroglycerin  Hypertension at goal continue current therapy  Reflux esophagitis continue OTC medication

## 2013-08-29 NOTE — Progress Notes (Signed)
Pre visit review using our clinic review tool, if applicable. No additional management support is needed unless otherwise documented below in the visit note. 

## 2013-08-29 NOTE — Patient Instructions (Signed)
Continue your current medications  Continue your 30 minute walk daily  Return in one year for general physical examination sooner if any problem

## 2013-08-29 NOTE — Telephone Encounter (Signed)
Relevant patient education mailed to patient.  

## 2013-09-17 DIAGNOSIS — H2510 Age-related nuclear cataract, unspecified eye: Secondary | ICD-10-CM | POA: Diagnosis not present

## 2013-10-01 ENCOUNTER — Other Ambulatory Visit: Payer: Self-pay | Admitting: Family Medicine

## 2013-11-06 DIAGNOSIS — H2510 Age-related nuclear cataract, unspecified eye: Secondary | ICD-10-CM | POA: Diagnosis not present

## 2013-11-06 DIAGNOSIS — H2589 Other age-related cataract: Secondary | ICD-10-CM | POA: Diagnosis not present

## 2013-11-06 DIAGNOSIS — H25019 Cortical age-related cataract, unspecified eye: Secondary | ICD-10-CM | POA: Diagnosis not present

## 2013-12-04 DIAGNOSIS — H2510 Age-related nuclear cataract, unspecified eye: Secondary | ICD-10-CM | POA: Diagnosis not present

## 2013-12-04 DIAGNOSIS — H2589 Other age-related cataract: Secondary | ICD-10-CM | POA: Diagnosis not present

## 2013-12-04 DIAGNOSIS — H25019 Cortical age-related cataract, unspecified eye: Secondary | ICD-10-CM | POA: Diagnosis not present

## 2014-02-01 ENCOUNTER — Ambulatory Visit (INDEPENDENT_AMBULATORY_CARE_PROVIDER_SITE_OTHER): Payer: Medicare Other

## 2014-02-01 DIAGNOSIS — Z23 Encounter for immunization: Secondary | ICD-10-CM | POA: Diagnosis not present

## 2014-02-04 DIAGNOSIS — L821 Other seborrheic keratosis: Secondary | ICD-10-CM | POA: Diagnosis not present

## 2014-02-04 DIAGNOSIS — D235 Other benign neoplasm of skin of trunk: Secondary | ICD-10-CM | POA: Diagnosis not present

## 2014-02-04 DIAGNOSIS — L819 Disorder of pigmentation, unspecified: Secondary | ICD-10-CM | POA: Diagnosis not present

## 2014-02-04 DIAGNOSIS — L570 Actinic keratosis: Secondary | ICD-10-CM | POA: Diagnosis not present

## 2014-06-11 NOTE — Progress Notes (Signed)
      HPI: FU coronary artery disease, ischemic cardiomyopathy, and hyperlipidemia. He had a myocardial infarction in December 5102 complicated by ventricular fibrillation arrest. He had PCI of his LAD at that time. His most recent Myoview in March of 2013 showed an ejection fraction of 30%. There was a prior anterior and apical infarct, but there was no ischemia. He has declined ICD previously. Previous carotid Dopplers in July of 2006 showed 0-39% bilateral stenosis. Abdominal ultrasound at that time showed no aneurysm. Since I last saw him, the patient denies any dyspnea on exertion, orthopnea, PND, pedal edema, palpitations, syncope or chest pain.   Current Outpatient Prescriptions  Medication Sig Dispense Refill  . aspirin 325 MG EC tablet Take 325 mg by mouth daily.      Marland Kitchen atorvastatin (LIPITOR) 80 MG tablet TAKE 1 TABLET BY MOUTH EVERY DAY 100 tablet 3  . carvedilol (COREG) 12.5 MG tablet TAKE 1 TABLET (12.5 MG TOTAL) BY MOUTH 2 (TWO) TIMES DAILY WITH A MEAL. 200 tablet 3  . lisinopril (PRINIVIL,ZESTRIL) 2.5 MG tablet TAKE 1 TABLET (2.5 MG TOTAL) BY MOUTH 2 (TWO) TIMES DAILY AT 10 AM AND 5 PM. 180 tablet 3  . nitroGLYCERIN (NITROSTAT) 0.4 MG SL tablet Place 1 tablet (0.4 mg total) under the tongue every 5 (five) minutes as needed. 25 tablet 1  . NON FORMULARY Ultra flora plus  daily     . ranitidine (ZANTAC) 150 MG capsule TAKE ONE CAPSULE BY MOUTH EVERY EVENING 100 capsule 3   No current facility-administered medications for this visit.     Past Medical History  Diagnosis Date  . Hypertension   . CAD (coronary artery disease)   . Cardiomyopathy   . GERD (gastroesophageal reflux disease)   . UTI   . RESPIRATORY FAILURE   . HYPERLIPIDEMIA   . Allergy   . Myocardial infarction     Past Surgical History  Procedure Laterality Date  . Tonsillectomy    . Cardiac catheterization  04-2003    stents    History   Social History  . Marital Status: Married    Spouse Name: N/A     Number of Children: N/A  . Years of Education: N/A   Occupational History  . Not on file.   Social History Main Topics  . Smoking status: Former Smoker -- 1.00 packs/day for 30 years    Types: Cigarettes    Quit date: 07/27/1980  . Smokeless tobacco: Not on file  . Alcohol Use: No  . Drug Use: No  . Sexual Activity: Yes    Birth Control/ Protection: None   Other Topics Concern  . Not on file   Social History Narrative    ROS: no fevers or chills, productive cough, hemoptysis, dysphasia, odynophagia, melena, hematochezia, dysuria, hematuria, rash, seizure activity, orthopnea, PND, pedal edema, claudication. Remaining systems are negative.  Physical Exam: Well-developed well-nourished in no acute distress.  Skin is warm and dry.  HEENT is normal.  Neck is supple.  Chest is clear to auscultation with normal expansion.  Cardiovascular exam is regular rate and rhythm.  Abdominal exam nontender or distended. No masses palpated. Extremities show no edema. neuro grossly intact  ECG sinus rhythm with occasional PVC. Prior septal and lateral infarct. Low voltage.

## 2014-06-13 ENCOUNTER — Encounter: Payer: Self-pay | Admitting: Cardiology

## 2014-06-13 ENCOUNTER — Ambulatory Visit (INDEPENDENT_AMBULATORY_CARE_PROVIDER_SITE_OTHER): Payer: Medicare Other | Admitting: Cardiology

## 2014-06-13 VITALS — BP 140/90 | HR 72 | Ht 67.5 in | Wt 148.2 lb

## 2014-06-13 DIAGNOSIS — I10 Essential (primary) hypertension: Secondary | ICD-10-CM | POA: Diagnosis not present

## 2014-06-13 DIAGNOSIS — I2510 Atherosclerotic heart disease of native coronary artery without angina pectoris: Secondary | ICD-10-CM | POA: Diagnosis not present

## 2014-06-13 NOTE — Assessment & Plan Note (Signed)
Continue aspirin and statin. 

## 2014-06-13 NOTE — Assessment & Plan Note (Signed)
Continue statin. Check lipids and liver. 

## 2014-06-13 NOTE — Assessment & Plan Note (Signed)
Continue present blood pressure medications. Check potassium and renal function. 

## 2014-06-13 NOTE — Patient Instructions (Signed)
Your physician wants you to follow-up in: ONE YEAR WITH DR CRENSHAW You will receive a reminder letter in the mail two months in advance. If you don't receive a letter, please call our office to schedule the follow-up appointment.   Your physician recommends that you return for lab work WHEN FASTING 

## 2014-06-13 NOTE — Assessment & Plan Note (Addendum)
Continue ACE inhibitor and beta blocker. I will not try to advance these medications as this has caused dizziness previously. Patient declines ICD.

## 2014-06-17 DIAGNOSIS — I2510 Atherosclerotic heart disease of native coronary artery without angina pectoris: Secondary | ICD-10-CM | POA: Diagnosis not present

## 2014-06-17 LAB — HEPATIC FUNCTION PANEL
ALK PHOS: 71 U/L (ref 39–117)
ALT: 15 U/L (ref 0–53)
AST: 18 U/L (ref 0–37)
Albumin: 3.8 g/dL (ref 3.5–5.2)
BILIRUBIN DIRECT: 0.2 mg/dL (ref 0.0–0.3)
Indirect Bilirubin: 0.6 mg/dL (ref 0.2–1.2)
Total Bilirubin: 0.8 mg/dL (ref 0.2–1.2)
Total Protein: 6.5 g/dL (ref 6.0–8.3)

## 2014-06-17 LAB — BASIC METABOLIC PANEL WITH GFR
BUN: 28 mg/dL — AB (ref 6–23)
CO2: 24 mEq/L (ref 19–32)
Calcium: 9.1 mg/dL (ref 8.4–10.5)
Chloride: 106 mEq/L (ref 96–112)
Creat: 1.42 mg/dL — ABNORMAL HIGH (ref 0.50–1.35)
GFR, EST AFRICAN AMERICAN: 54 mL/min — AB
GFR, Est Non African American: 46 mL/min — ABNORMAL LOW
Glucose, Bld: 95 mg/dL (ref 70–99)
POTASSIUM: 4.1 meq/L (ref 3.5–5.3)
Sodium: 140 mEq/L (ref 135–145)

## 2014-06-17 LAB — LIPID PANEL
CHOLESTEROL: 99 mg/dL (ref 0–200)
HDL: 46 mg/dL (ref >39)
LDL Cholesterol: 42 mg/dL (ref 0–99)
TRIGLYCERIDES: 55 mg/dL (ref <150)
Total CHOL/HDL Ratio: 2.2 Ratio
VLDL: 11 mg/dL (ref 0–40)

## 2014-06-18 ENCOUNTER — Encounter: Payer: Self-pay | Admitting: *Deleted

## 2014-07-24 DIAGNOSIS — H0011 Chalazion right upper eyelid: Secondary | ICD-10-CM | POA: Diagnosis not present

## 2014-09-11 ENCOUNTER — Other Ambulatory Visit: Payer: Self-pay | Admitting: Family Medicine

## 2014-09-19 ENCOUNTER — Other Ambulatory Visit (INDEPENDENT_AMBULATORY_CARE_PROVIDER_SITE_OTHER): Payer: Medicare Other

## 2014-09-19 DIAGNOSIS — R300 Dysuria: Secondary | ICD-10-CM | POA: Diagnosis not present

## 2014-09-19 DIAGNOSIS — D649 Anemia, unspecified: Secondary | ICD-10-CM

## 2014-09-19 DIAGNOSIS — E785 Hyperlipidemia, unspecified: Secondary | ICD-10-CM

## 2014-09-19 DIAGNOSIS — N400 Enlarged prostate without lower urinary tract symptoms: Secondary | ICD-10-CM | POA: Diagnosis not present

## 2014-09-19 DIAGNOSIS — E039 Hypothyroidism, unspecified: Secondary | ICD-10-CM | POA: Diagnosis not present

## 2014-09-19 DIAGNOSIS — I10 Essential (primary) hypertension: Secondary | ICD-10-CM | POA: Diagnosis not present

## 2014-09-19 LAB — CBC WITH DIFFERENTIAL/PLATELET
BASOS PCT: 0.5 % (ref 0.0–3.0)
Basophils Absolute: 0 10*3/uL (ref 0.0–0.1)
EOS ABS: 0.1 10*3/uL (ref 0.0–0.7)
EOS PCT: 1.6 % (ref 0.0–5.0)
HCT: 43.1 % (ref 39.0–52.0)
Hemoglobin: 14.3 g/dL (ref 13.0–17.0)
LYMPHS ABS: 2.3 10*3/uL (ref 0.7–4.0)
Lymphocytes Relative: 39.7 % (ref 12.0–46.0)
MCHC: 33.2 g/dL (ref 30.0–36.0)
MCV: 93 fl (ref 78.0–100.0)
Monocytes Absolute: 0.4 10*3/uL (ref 0.1–1.0)
Monocytes Relative: 7.8 % (ref 3.0–12.0)
NEUTROS PCT: 50.4 % (ref 43.0–77.0)
Neutro Abs: 2.9 10*3/uL (ref 1.4–7.7)
PLATELETS: 137 10*3/uL — AB (ref 150.0–400.0)
RBC: 4.63 Mil/uL (ref 4.22–5.81)
RDW: 14.9 % (ref 11.5–15.5)
WBC: 5.8 10*3/uL (ref 4.0–10.5)

## 2014-09-19 LAB — HEPATIC FUNCTION PANEL
ALT: 18 U/L (ref 0–53)
AST: 18 U/L (ref 0–37)
Albumin: 3.7 g/dL (ref 3.5–5.2)
Alkaline Phosphatase: 65 U/L (ref 39–117)
Bilirubin, Direct: 0.2 mg/dL (ref 0.0–0.3)
TOTAL PROTEIN: 6.4 g/dL (ref 6.0–8.3)
Total Bilirubin: 0.7 mg/dL (ref 0.2–1.2)

## 2014-09-19 LAB — BASIC METABOLIC PANEL
BUN: 25 mg/dL — AB (ref 6–23)
CO2: 28 mEq/L (ref 19–32)
CREATININE: 1.28 mg/dL (ref 0.40–1.50)
Calcium: 9 mg/dL (ref 8.4–10.5)
Chloride: 107 mEq/L (ref 96–112)
GFR: 57.4 mL/min — ABNORMAL LOW (ref >60.00)
Glucose, Bld: 86 mg/dL (ref 70–99)
Potassium: 3.7 mEq/L (ref 3.5–5.1)
Sodium: 141 mEq/L (ref 135–145)

## 2014-09-19 LAB — POCT URINALYSIS DIPSTICK
Bilirubin, UA: NEGATIVE
Glucose, UA: NEGATIVE
KETONES UA: NEGATIVE
Leukocytes, UA: NEGATIVE
Nitrite, UA: NEGATIVE
PH UA: 6
PROTEIN UA: NEGATIVE
RBC UA: NEGATIVE
SPEC GRAV UA: 1.02
Urobilinogen, UA: 0.2

## 2014-09-19 LAB — LIPID PANEL
CHOL/HDL RATIO: 2
Cholesterol: 108 mg/dL (ref 0–200)
HDL: 47.9 mg/dL (ref >39.00)
LDL CALC: 48 mg/dL (ref 0–99)
NONHDL: 60.1
Triglycerides: 63 mg/dL (ref 0.0–149.0)
VLDL: 12.6 mg/dL (ref 0.0–40.0)

## 2014-09-19 LAB — PSA: PSA: 0.64 ng/mL (ref 0.10–4.00)

## 2014-09-19 LAB — TSH: TSH: 6.32 u[IU]/mL — AB (ref 0.35–4.50)

## 2014-09-24 ENCOUNTER — Ambulatory Visit (INDEPENDENT_AMBULATORY_CARE_PROVIDER_SITE_OTHER): Payer: Medicare Other | Admitting: Family Medicine

## 2014-09-24 ENCOUNTER — Encounter: Payer: Self-pay | Admitting: Family Medicine

## 2014-09-24 VITALS — BP 110/80 | Temp 97.8°F | Ht 68.0 in | Wt 146.0 lb

## 2014-09-24 DIAGNOSIS — E785 Hyperlipidemia, unspecified: Secondary | ICD-10-CM | POA: Diagnosis not present

## 2014-09-24 DIAGNOSIS — N401 Enlarged prostate with lower urinary tract symptoms: Secondary | ICD-10-CM

## 2014-09-24 DIAGNOSIS — R351 Nocturia: Secondary | ICD-10-CM | POA: Diagnosis not present

## 2014-09-24 DIAGNOSIS — I10 Essential (primary) hypertension: Secondary | ICD-10-CM

## 2014-09-24 MED ORDER — LISINOPRIL 2.5 MG PO TABS: ORAL_TABLET | ORAL | Status: DC

## 2014-09-24 MED ORDER — ATORVASTATIN CALCIUM 80 MG PO TABS: ORAL_TABLET | ORAL | Status: DC

## 2014-09-24 MED ORDER — CARVEDILOL 12.5 MG PO TABS: ORAL_TABLET | ORAL | Status: DC

## 2014-09-24 NOTE — Patient Instructions (Signed)
Continue current medications  Follow-up in one year sooner if any problems  Diplomatic Services operational officer...........Marland Kitchen our new adult nurse practitioner from [facility]

## 2014-09-24 NOTE — Progress Notes (Signed)
   Subjective:    Patient ID: [name] [name], male    DOB: 01-22-1932, 81 y.o.   MRN: [id number]  HPI [name] [name] is a 81 year old married male nonsmoker who comes in today for general physical examination because of history of hyperlipidemia, cardiomyopathy, coronary artery disease, reflux esophagitis  He takes Lipitor 80 mg daily and an aspirin tablet because of a history of hyperlipidemia. HDL 48 LDL 48 total 108  He takes cord 12.5 mg twice a day and lisinopril 2.5 mg twice a day because of a history of heart failure. His history of coronary disease but asymptomatic  He takes OTC Zantac  BP here today 110/70 BP home 1 at home 130/70. He says he is not lightheaded when he stands up  He gets routine eye care, dental care, never had a colonoscopy. We did a flexible sigmoidoscopy about 20 years ago was normal. Asymptomatic no family history of colon cancer. Therefore day JDL don't think he needs to have a screening colonoscopy  Vaccinations up-to-date per Apolonio Schneiders  Cognitive function normal he walks on our a day home health safety reviewed no issues identified, no guns in the house, he does have a healthcare power of attorney and living well   Review of Systems  Constitutional: Negative.   HENT: Negative.   Eyes: Negative.   Respiratory: Negative.   Cardiovascular: Negative.   Gastrointestinal: Negative.   Endocrine: Negative.   Genitourinary: Negative.   Musculoskeletal: Negative.   Skin: Negative.   Allergic/Immunologic: Negative.   Neurological: Negative.   Hematological: Negative.   Psychiatric/Behavioral: Negative.        Objective:   Physical Exam  Constitutional: He is oriented to person, place, and time. He appears well-developed and well-nourished.  HENT:  Head: Normocephalic and atraumatic.  Right Ear: External ear normal.  Left Ear: External ear normal.  Nose: Nose normal.  Mouth/Throat: Oropharynx is clear and moist.  Eyes: Conjunctivae and EOM are normal. Pupils  are equal, round, and reactive to light.  Neck: Normal range of motion. Neck supple. No JVD present. No tracheal deviation present. No thyromegaly present.  Cardiovascular: Normal rate, regular rhythm, normal heart sounds and intact distal pulses.  Exam reveals no gallop and no friction rub.   No murmur heard. Pulmonary/Chest: Effort normal and breath sounds normal. No stridor. No respiratory distress. He has no wheezes. He has no rales. He exhibits no tenderness.  Abdominal: Soft. Bowel sounds are normal. He exhibits no distension and no mass. There is no tenderness. There is no rebound and no guarding.  Genitourinary: Rectum normal and penis normal. Guaiac negative stool. No penile tenderness.  2+ symmetrical nonnodular BPH  Musculoskeletal: Normal range of motion. He exhibits no edema or tenderness.  Lymphadenopathy:    He has no cervical adenopathy.  Neurological: He is alert and oriented to person, place, and time. He has normal reflexes. No cranial nerve deficit. He exhibits normal muscle tone.  Skin: Skin is warm and dry. No rash noted. No erythema. No pallor.  Psychiatric: He has a normal mood and affect. His behavior is normal. Judgment and thought content normal.  Nursing note and vitals reviewed.         Assessment & Plan:  Hyperlipidemia at goal,,,,,,,,,, continue current therapy  History of cardiomyopathy,,,, continue current therapy  Coronary disease asymptomatic  Reflux esophagitis OTC Zantac  BPH with nocturia asymptomatic therefore no

## 2014-09-24 NOTE — Progress Notes (Signed)
Pre visit review using our clinic review tool, if applicable. No additional management support is needed unless otherwise documented below in the visit note. 

## 2014-10-21 ENCOUNTER — Other Ambulatory Visit: Payer: Self-pay | Admitting: Family Medicine

## 2014-11-04 ENCOUNTER — Other Ambulatory Visit: Payer: Self-pay | Admitting: Internal Medicine

## 2014-11-04 DIAGNOSIS — R109 Unspecified abdominal pain: Secondary | ICD-10-CM

## 2014-11-15 ENCOUNTER — Other Ambulatory Visit: Payer: Self-pay | Admitting: Family Medicine

## 2014-11-19 ENCOUNTER — Other Ambulatory Visit: Payer: Self-pay | Admitting: Family Medicine

## 2015-01-15 DIAGNOSIS — Z0100 Encounter for examination of eyes and vision without abnormal findings: Secondary | ICD-10-CM | POA: Diagnosis not present

## 2015-01-15 DIAGNOSIS — H01004 Unspecified blepharitis left upper eyelid: Secondary | ICD-10-CM | POA: Diagnosis not present

## 2015-01-15 DIAGNOSIS — Z961 Presence of intraocular lens: Secondary | ICD-10-CM | POA: Diagnosis not present

## 2015-01-15 DIAGNOSIS — H01001 Unspecified blepharitis right upper eyelid: Secondary | ICD-10-CM | POA: Diagnosis not present

## 2015-02-11 ENCOUNTER — Ambulatory Visit (INDEPENDENT_AMBULATORY_CARE_PROVIDER_SITE_OTHER): Payer: Medicare Other

## 2015-02-11 ENCOUNTER — Ambulatory Visit: Payer: BLUE CROSS/BLUE SHIELD

## 2015-02-11 DIAGNOSIS — Z23 Encounter for immunization: Secondary | ICD-10-CM | POA: Diagnosis not present

## 2015-03-17 DIAGNOSIS — L919 Hypertrophic disorder of the skin, unspecified: Secondary | ICD-10-CM | POA: Diagnosis not present

## 2015-03-17 DIAGNOSIS — L570 Actinic keratosis: Secondary | ICD-10-CM | POA: Diagnosis not present

## 2015-03-17 DIAGNOSIS — L821 Other seborrheic keratosis: Secondary | ICD-10-CM | POA: Diagnosis not present

## 2015-03-17 DIAGNOSIS — D225 Melanocytic nevi of trunk: Secondary | ICD-10-CM | POA: Diagnosis not present

## 2015-03-17 DIAGNOSIS — D1801 Hemangioma of skin and subcutaneous tissue: Secondary | ICD-10-CM | POA: Diagnosis not present

## 2015-06-09 NOTE — Progress Notes (Signed)
      HPI: FU coronary artery disease, ischemic cardiomyopathy, and hyperlipidemia. He had a myocardial infarction in December [id number] complicated by ventricular fibrillation arrest. He had PCI of his LAD at that time. His most recent Myoview in March of 2013 showed an ejection fraction of 30%. There was a prior anterior and apical infarct, but there was no ischemia. He has declined ICD previously. Previous carotid Dopplers in July of 2006 showed 0-39% bilateral stenosis. Abdominal ultrasound at that time showed no aneurysm. Since I last saw him, the patient denies any dyspnea on exertion, orthopnea, PND, pedal edema, palpitations, syncope or chest pain.   Current Outpatient Prescriptions  Medication Sig Dispense Refill  . aspirin 325 MG EC tablet Take 325 mg by mouth daily.      Marland Kitchen atorvastatin (LIPITOR) 80 MG tablet TAKE 1 TABLET BY MOUTH EVERY DAY 100 tablet 2  . carvedilol (COREG) 12.5 MG tablet TAKE 1 TABLET (12.5 MG TOTAL) BY MOUTH 2 (TWO) TIMES DAILY WITH A MEAL. 200 tablet 0  . lisinopril (PRINIVIL,ZESTRIL) 2.5 MG tablet TAKE 1 TABLET (2.5 MG TOTAL) BY MOUTH 2 (TWO) TIMES DAILY AT 10 AM AND 5 PM. 180 tablet 3  . nitroGLYCERIN (NITROSTAT) 0.4 MG SL tablet Place 1 tablet (0.4 mg total) under the tongue every 5 (five) minutes as needed. 25 tablet 1  . NON FORMULARY Ultra flora plus  daily     . ranitidine (ZANTAC) 150 MG capsule TAKE ONE CAPSULE BY MOUTH EVERY EVENING 100 capsule 3   No current facility-administered medications for this visit.     Past Medical History  Diagnosis Date  . Hypertension   . CAD (coronary artery disease)   . Cardiomyopathy   . GERD (gastroesophageal reflux disease)   . UTI   . RESPIRATORY FAILURE   . HYPERLIPIDEMIA   . Allergy   . Myocardial infarction [facility])     Past Surgical History  Procedure Laterality Date  . Tonsillectomy    . Cardiac catheterization  04-2003    stents    Social History   Social History  . Marital Status: Married   Spouse Name: N/A  . Number of Children: N/A  . Years of Education: N/A   Occupational History  . Not on file.   Social History Main Topics  . Smoking status: Former Smoker -- 1.00 packs/day for 30 years    Types: Cigarettes    Quit date: 07/27/1980  . Smokeless tobacco: Not on file  . Alcohol Use: No  . Drug Use: No  . Sexual Activity: Yes    Birth Control/ Protection: None   Other Topics Concern  . Not on file   Social History Narrative    Family History  Problem Relation Age of Onset  . Coronary artery disease    . Hypertension      ROS: no fevers or chills, productive cough, hemoptysis, dysphasia, odynophagia, melena, hematochezia, dysuria, hematuria, rash, seizure activity, orthopnea, PND, pedal edema, claudication. Remaining systems are negative.  Physical Exam: Well-developed well-nourished in no acute distress.  Skin is warm and dry.  HEENT is normal.  Neck is supple.  Chest is clear to auscultation with normal expansion.  Cardiovascular exam is regular rate and rhythm.  Abdominal exam nontender or distended. No masses palpated. Extremities show no edema. neuro grossly intact  ECG Sinus rhythm at a rate of 59. Anterior infarct.

## 2015-06-12 ENCOUNTER — Ambulatory Visit (INDEPENDENT_AMBULATORY_CARE_PROVIDER_SITE_OTHER): Payer: Medicare Other | Admitting: Cardiology

## 2015-06-12 ENCOUNTER — Encounter: Payer: Self-pay | Admitting: Cardiology

## 2015-06-12 VITALS — BP 134/80 | HR 59 | Ht 67.0 in | Wt 144.5 lb

## 2015-06-12 DIAGNOSIS — I10 Essential (primary) hypertension: Secondary | ICD-10-CM

## 2015-06-12 DIAGNOSIS — I2510 Atherosclerotic heart disease of native coronary artery without angina pectoris: Secondary | ICD-10-CM

## 2015-06-12 DIAGNOSIS — E785 Hyperlipidemia, unspecified: Secondary | ICD-10-CM

## 2015-06-12 NOTE — Assessment & Plan Note (Signed)
Continue statin. Lipids and liver monitored by primary care. 

## 2015-06-12 NOTE — Assessment & Plan Note (Signed)
Continue ACE inhibitor and beta blocker. Patient had problems with dizziness when I try to advance medications previously. He has previously declined ICD. He understands risk of sudden death.

## 2015-06-12 NOTE — Assessment & Plan Note (Signed)
Continue aspirin and statin. 

## 2015-06-12 NOTE — Patient Instructions (Signed)
Your physician wants you to follow-up in: ONE YEAR WITH DR CRENSHAW You will receive a reminder letter in the mail two months in advance. If you don't receive a letter, please call our office to schedule the follow-up appointment.   If you need a refill on your cardiac medications before your next appointment, please call your pharmacy.  

## 2015-06-12 NOTE — Assessment & Plan Note (Signed)
Blood pressure controlled. Continue present medications. Potassium and renal function monitored by primary care. 

## 2015-09-25 ENCOUNTER — Encounter: Payer: Self-pay | Admitting: Adult Health

## 2015-09-25 ENCOUNTER — Ambulatory Visit (INDEPENDENT_AMBULATORY_CARE_PROVIDER_SITE_OTHER): Payer: Medicare Other | Admitting: Adult Health

## 2015-09-25 VITALS — BP 140/82 | Temp 98.1°F | Wt 145.6 lb

## 2015-09-25 DIAGNOSIS — G250 Essential tremor: Secondary | ICD-10-CM | POA: Diagnosis not present

## 2015-09-25 DIAGNOSIS — I2510 Atherosclerotic heart disease of native coronary artery without angina pectoris: Secondary | ICD-10-CM | POA: Diagnosis not present

## 2015-09-25 DIAGNOSIS — Z7189 Other specified counseling: Secondary | ICD-10-CM | POA: Diagnosis not present

## 2015-09-25 DIAGNOSIS — I10 Essential (primary) hypertension: Secondary | ICD-10-CM | POA: Diagnosis not present

## 2015-09-25 DIAGNOSIS — Z7689 Persons encountering health services in other specified circumstances: Secondary | ICD-10-CM

## 2015-09-25 MED ORDER — PRIMIDONE 50 MG PO TABS: 50.0000 mg | ORAL_TABLET | Freq: Every day | ORAL | Status: DC

## 2015-09-25 NOTE — Progress Notes (Addendum)
Patient presents to clinic today to establish care. He is a pleasant 82 year old male who  has a past medical history of Hypertension; CAD (coronary artery disease); Cardiomyopathy; GERD (gastroesophageal reflux disease); UTI; RESPIRATORY FAILURE; HYPERLIPIDEMIA; Allergy; and Myocardial infarction ([place]).  His last physical was in 09/2014 with MD Sherren Mocha.   Acute Concerns: Establish Care   Tremor - For an unknown amount of time " years" he has had a tremor that is worse in the right arm. The tremor has been getting worse. He reports that the men in his family get this tremor. Is finding it hard to feed himself and brush teeth. Denies any family history of parkinson's.   Chronic Issues: Hypertension  - He feels as though it is well controlled. At home this morning it was 119/70. He reports an increase when he comes to the medical office.   Cardiomyopathy/ MI - Follows up with Dr. Stanford Breed yearly. Last saw him in February 2017. EKG done (sinus rhythm with occasional PVC. Prior septal and lateral infarct. Low voltage)  - His most recent Myoview in March of 2013 showed an ejection fraction of 30%.  Health Maintenance: Dental -- Twice a year Vision --Yearly  Immunizations -- UTD Colonoscopy -- No longer need Diet: Eats healthy  Exercise: Walks an hour each day    Is Followed by: Cardiology - Dr. Stanford Breed - yearly  Dermatology - Dr. Allyson Sabal  Past Medical History  Diagnosis Date  . Hypertension   . CAD (coronary artery disease)   . Cardiomyopathy   . GERD (gastroesophageal reflux disease)   . UTI   . RESPIRATORY FAILURE   . HYPERLIPIDEMIA   . Allergy   . Myocardial infarction [facility])     Past Surgical History  Procedure Laterality Date  . Tonsillectomy    . Cardiac catheterization  04-2003    stents    Current Outpatient Prescriptions on File Prior to Visit  Medication Sig Dispense Refill  . aspirin 325 MG EC tablet Take 325 mg by mouth daily.      Marland Kitchen atorvastatin  (LIPITOR) 80 MG tablet TAKE 1 TABLET BY MOUTH EVERY DAY 100 tablet 2  . carvedilol (COREG) 12.5 MG tablet TAKE 1 TABLET (12.5 MG TOTAL) BY MOUTH 2 (TWO) TIMES DAILY WITH A MEAL. 200 tablet 0  . lisinopril (PRINIVIL,ZESTRIL) 2.5 MG tablet TAKE 1 TABLET (2.5 MG TOTAL) BY MOUTH 2 (TWO) TIMES DAILY AT 10 AM AND 5 PM. 180 tablet 3  . nitroGLYCERIN (NITROSTAT) 0.4 MG SL tablet Place 1 tablet (0.4 mg total) under the tongue every 5 (five) minutes as needed. 25 tablet 1  . NON FORMULARY Ultra flora plus  daily     . ranitidine (ZANTAC) 150 MG capsule TAKE ONE CAPSULE BY MOUTH EVERY EVENING 100 capsule 3   No current facility-administered medications on file prior to visit.    Allergies  Allergen Reactions  . Sulfonamide Derivatives     REACTION: history    Family History  Problem Relation Age of Onset  . Coronary artery disease    . Hypertension      Social History   Social History  . Marital Status: Married    Spouse Name: N/A  . Number of Children: N/A  . Years of Education: N/A   Occupational History  . Not on file.   Social History Main Topics  . Smoking status: Former Smoker -- 1.00 packs/day for 30 years    Types: Cigarettes    Quit date:  07/27/1980  . Smokeless tobacco: Not on file  . Alcohol Use: No  . Drug Use: No  . Sexual Activity: Yes    Birth Control/ Protection: None   Other Topics Concern  . Not on file   Social History Narrative    Review of Systems  Constitutional: Negative.   HENT: Negative.   Eyes: Negative.   Cardiovascular: Negative.   Gastrointestinal: Negative.   Genitourinary: Negative.   Skin: Negative.   Neurological: Negative.   All other systems reviewed and are negative.     BP 140/82 mmHg  Temp(Src) 98.1 F (36.7 C) (Oral)  Wt 145 lb 9.6 oz (66.044 kg)  Physical Exam  Constitutional: He is oriented to person, place, and time and well-developed, well-nourished, and in no distress. No distress.  HENT:  Head: Normocephalic and  atraumatic.  Right Ear: External ear normal.  Left Ear: External ear normal.  Nose: Nose normal.  Mouth/Throat: Oropharynx is clear and moist. No oropharyngeal exudate.  Eyes: Conjunctivae and EOM are normal. Pupils are equal, round, and reactive to light. Right eye exhibits no discharge. Left eye exhibits no discharge.  Cardiovascular: Normal rate, regular rhythm, normal heart sounds and intact distal pulses.  Exam reveals no gallop and no friction rub.   No murmur heard. Pulmonary/Chest: Effort normal and breath sounds normal. No respiratory distress. He has no wheezes. He has no rales. He exhibits no tenderness.  Neurological: He is alert and oriented to person, place, and time. He has normal sensation, normal strength, normal reflexes and intact cranial nerves. He displays tremor. He displays normal reflexes. No cranial nerve deficit. He exhibits normal muscle tone. Gait normal. Coordination normal. GCS score is 15.  Tremor in upper extremities R>L. No pill rolling noted. No cog wheel  Skin: Skin is warm and dry. No rash noted. He is not diaphoretic. No erythema. No pallor.  Psychiatric: Mood, memory, affect and judgment normal.  Nursing note and vitals reviewed.   Assessment/Plan:  1. Encounter to establish care - Follow up for MWE with Manuela Schwartz - Follow up with me for visit related to HTN, Hyperlipidemia, CAD.  - Stay active and eat healthy.   2. Essential hypertension - Well controlled with current medication   3. Essential tremor - Advised to take before bed. Start off with 50 mg, if after a week he does not have any improvement then to increase to 100mg .  - primidone (MYSOLINE) 50 MG tablet; Take 1 tablet (50 mg total) by mouth at bedtime.  Dispense: 30 tablet; Refill: 3 - Consider referral to neurology    Dorothyann Peng, NP

## 2015-09-25 NOTE — Patient Instructions (Addendum)
It was great meeting you today  I have sent in a medication called Primidone to hopefully help with your tremors. Start with one pill at night. If you do not notice a difference in a few days then take two pills at night.   Follow up with me for your annual exam   I would also like for you to sign up for an annual wellness visit on a Friday with our nurse Manuela Schwartz. This is a free benefit under medicare that may help [place] find additional ways to help you.

## 2015-10-17 ENCOUNTER — Ambulatory Visit (INDEPENDENT_AMBULATORY_CARE_PROVIDER_SITE_OTHER): Payer: Medicare Other

## 2015-10-17 VITALS — Ht 68.0 in | Wt 146.6 lb

## 2015-10-17 DIAGNOSIS — Z0000 Encounter for general adult medical examination without abnormal findings: Secondary | ICD-10-CM | POA: Diagnosis not present

## 2015-10-17 NOTE — Progress Notes (Signed)
Subjective:   [name] [name] is a 82 y.o. male who presents for Medicare Annual/Subsequent preventive examination.  Review of Systems:   HRA assessment completed during visit; [place]  The Patient was informed that the wellness visit is to identify their future health risk and educate on measures he can take to avoid chronic disease  NO ROS; Medicare Wellness Visit Last CPE completed: 09/2015 to establish care Labs completed: hyperlipidemia medically controlled; ratio 2    Health is described as ok; doesn't feel bad  Pain in back; (1-10) 5 most of the day  Lifestyle review and risk:  Hyperlipidemia (hx MI with Arrest 2004) New Years Eve  No issues since;   Psychosocial: married x 58 year in Nov;  Has 2 dtrs; both live here   Tobacco: 30 pack years; quit 07/1980 Just decided to quit  ETOH none reported  BMI: 22   Diet;  Breakfast; has cereal; egg with  yoke; bagels and peanut butter  Buys almond butter on occasion Lunch; home chicken pie; vegetables Supper; go out for supper; various places; cracker barrel;  Wife bakes; uses sugar substitute    Exercise; (EF 30%)  Walking an hour a day x 6 days a week  Wife walks with him for about 15 minutes   HOME SAFETY; one level with walk in basement; doesn't do stairs but 2 times a week; freezer downstairs;  Fall hx; no falls  Fall risk: Fear of falling? No fear of falling Tremor; familial; sister's 2 boys and father and grandfather;  Given medication takes one;    Given education on "Fall Prevention in the Home" for more safety tips the patient can apply as appropriate.  Long term goal is to "age in place" or undecided   Community safety; YES; good neighbors  Oceanographer YES Firearms safety reviewed and will keep in a safe place if these exist. Driving accidents; no; Wears seatbelt; no ( dear ran out in front of car)  Advised to use sun protection or large brim hat  Stressors (1-5) 2   Depression:  Denies feeling depressed or hopeless; voices pleasure in daily life  Cognitive; states he feels his is good. Recall 2/3 but then repeated the 3 prior to leaving. Calculated serial 7's from 100 x 5; States on Ad8 score that he has a little more trouble using tools etc, but then states this is due to tremors.   Manages checkbook, medications; no failures of task Ad8 score reviewed for issues;  Issues making decisions; no  Less interest in hobbies / activities" no  Repeats questions, stories; family complaining: NO  Trouble using ordinary gadgets; microwave; computer: no  Forgets the month or year: no  Mismanaging finances: no  Missing apt: no but does write them down  Daily problems with thinking of memory NO Ad8 score is 0   Fall assessment no Gait assessment appears normal; essential tremors interfere with eating, signing checks; writing in generally; drops a lot of items; very frustrating him Suggested he may want to try some adaptive devices for eating and to complete simple task. Can discuss with Talbert Forest on next OV   Mobilization and Functional losses from last year to this year? No; tremors a little worse    Sleep pattern changes; wake up a lot during the night to void;  Gets x1; goes back to sleep;   Urinary or fecal incontinence reviewed: no issues    Counseling Health Maintenance Gaps:  Colonoscopy; aged out EKG: 06/2015  Hearing:  Right ear 2000hz   Left ear 4000hz   Difficulty hearing a whisper; no  Tinnitus; sometimes; ringing during assessment;  States he does not pay attention to this.   Ophthalmology exam; every year; Dr. Prudencio Burly; no issues; Nov 2017 is his next apt.   Immunizations Due: utd   Advanced Directive addressed; has a will; been years since they made  Father thought father was going to die at 61; died at 38; He was supposed to live a few years after MI and that was in 2004; Does not feel comfortable with "pulling vent" but took AD home to  review;    Individual Goal: Would like for tremors to subside States the medication; Primidone has helped, but was afraid to take 2nd pill due to reading SE. Encouraged to discuss with Talbert Forest may need to take 2 to benefit;   Health Recommendations and Referrals Will also discuss possible OT referral for adaptive devices    Barriers to Success None noted     Current Care Team reviewed and updated Dr Stanford Breed; tells him to keep walking  Dr. Prudencio Burly for eyes Dr. Rosalita Chessman; skin Dr. Link Snuffer for skin cancer on face  Dr. Isaias Cowman for dentist every 6 months     Education provided and lifestyle risk discussed   All Health Maintenance Gaps Reviewed for closure   Cardiac Risk Factors include: advanced age (>23men, >27 women);dyslipidemia;family history of premature cardiovascular disease;hypertension     Objective:    Vitals: Ht 5\' 8"  (1.727 m)  Wt 146 lb 9 oz (66.48 kg)  BMI 22.29 kg/m2  Body mass index is 22.29 kg/(m^2).  Neglected to check BP today but was just seen  In may and again in June; BP has been stable   Tobacco History  Smoking status  . Former Smoker -- 1.00 packs/day for 30 years  . Types: Cigarettes  . Quit date: 07/27/1980  Smokeless tobacco  . Not on file     Counseling given: Not Answered   Past Medical History  Diagnosis Date  . Hypertension   . CAD (coronary artery disease)   . Cardiomyopathy   . GERD (gastroesophageal reflux disease)   . UTI   . RESPIRATORY FAILURE   . HYPERLIPIDEMIA   . Allergy   . Myocardial infarction ([place]) 2004  . BPH (benign prostatic hyperplasia)   . Skin cancer     unknown if BCC or SCC  . Tremor    Past Surgical History  Procedure Laterality Date  . Tonsillectomy    . Cardiac catheterization  04-2003    stents  . Cataract extraction Bilateral    Family History  Problem Relation Age of Onset  . Coronary artery disease Father   . Hypertension Father   . Hypertension Father   . Heart attack Father   . Skin  cancer Father    History  Sexual Activity  . Sexual Activity: Yes  . Birth Control/ Protection: None    Outpatient Encounter Prescriptions as of 10/17/2015  Medication Sig  . aspirin 325 MG EC tablet Take 325 mg by mouth daily.    Marland Kitchen atorvastatin (LIPITOR) 80 MG tablet TAKE 1 TABLET BY MOUTH EVERY DAY  . carvedilol (COREG) 12.5 MG tablet TAKE 1 TABLET (12.5 MG TOTAL) BY MOUTH 2 (TWO) TIMES DAILY WITH A MEAL.  Marland Kitchen lisinopril (PRINIVIL,ZESTRIL) 2.5 MG tablet TAKE 1 TABLET (2.5 MG TOTAL) BY MOUTH 2 (TWO) TIMES DAILY AT 10 AM AND 5 PM.  . nitroGLYCERIN (NITROSTAT) 0.4 MG SL  tablet Place 1 tablet (0.4 mg total) under the tongue every 5 (five) minutes as needed.  . NON FORMULARY Ultra flora plus  daily   . primidone (MYSOLINE) 50 MG tablet Take 1 tablet (50 mg total) by mouth at bedtime.  . ranitidine (ZANTAC) 150 MG capsule TAKE ONE CAPSULE BY MOUTH EVERY EVENING   No facility-administered encounter medications on file as of 10/17/2015.    Activities of Daily Living In your present state of health, do you have any difficulty performing the following activities: 10/17/2015  Hearing? N  Vision? N  Difficulty concentrating or making decisions? N  Walking or climbing stairs? Y  Dressing or bathing? N  Doing errands, shopping? N  Preparing Food and eating ? N  Using the Toilet? N  In the past six months, have you accidently leaked urine? N  Do you have problems with loss of bowel control? N  Managing your Medications? N  Managing your Finances? N  Housekeeping or managing your Housekeeping? N    Patient Care Team: Dorothyann Peng, NP as PCP - General (Family Medicine)   Assessment:     Exercise Activities and Dietary recommendations Walks 3 miles a day with 30% EF; states he tolerates well with no difficulty  Current Exercise Habits: Home exercise routine, Type of exercise: walking, Time (Minutes): > 60, Frequency (Times/Week): 3, Weekly Exercise (Minutes/Week): 0, Intensity:  Moderate  Goals    . patient     Maintenance of health; keep walking      Fall Risk Fall Risk  10/17/2015 09/24/2014 08/29/2013  Falls in the past year? No No No   Depression Screen PHQ 2/9 Scores 10/17/2015 09/24/2014 08/29/2013  PHQ - 2 Score 0 0 0    Cognitive Testing No flowsheet data found.  Noted above; no difficulties noted; good historian  Immunization History  Administered Date(s) Administered  . H1N1 04/16/2008  . Influenza Split 02/10/2011, 01/28/2012  . Influenza Whole 02/17/2007, 02/13/2008, 02/13/2009, 02/12/2010  . Influenza, High Dose Seasonal PF 02/14/2013, 02/11/2015  . Influenza,inj,Quad PF,36+ Mos 02/01/2014  . Pneumococcal Conjugate-13 08/29/2013  . Pneumococcal Polysaccharide-23 05/30/2002, 04/16/2008  . Td 05/30/2002  . Tetanus 08/15/2012  . Zoster 04/21/2006   Screening Tests Health Maintenance  Topic Date Due  . INFLUENZA VACCINE  12/09/2015  . TETANUS/TDAP  08/16/2022  . ZOSTAVAX  Completed  . PNA vac Low Risk Adult  Completed      Plan:    During the course of the visit the patient was educated and counseled about the following appropriate screening and preventive services:   Vaccines to include Pneumoccal, Influenza, Hepatitis B, Td, Zostavax, HCV/ up to date   Electrocardiogram/ deferred  Cardiovascular Disease/ defer to [facility] in Cardiology  Colorectal cancer screening; aged out  Diabetes screening/neg  Prostate Cancer Screening/  Glaucoma screening/ neg  Nutrition counseling / adequate  Smoking cessation counseling/ quit   Patient Instructions (the written plan) was given to the patient.    Wynetta Fines, RN  10/17/2015

## 2015-10-17 NOTE — Patient Instructions (Addendum)
[name] [name] , Thank you for taking time to come for your Medicare Wellness Visit. I appreciate your ongoing commitment to your health goals. Please review the following plan we discussed and let me know if I can assist you in the future.   May benefit from adapative tools; can discuss screening with OT with [name] next visit May check out web site; [email or web address]  Will have eye exam in November;   Will  re-evaluate taking 2 mysoline when he sees NP; States he is afraid of SE but one has helped     These are the goals we discussed: Goals    . patient     Maintenance of health; keep walking       This is a list of the screening recommended for you and due dates:  Health Maintenance  Topic Date Due  . Flu Shot  12/09/2015  . Tetanus Vaccine  08/16/2022  . Shingles Vaccine  Completed  . Pneumonia vaccines  Completed     Fall Prevention in the Home  Falls can cause injuries. They can happen to people of all ages. There are many things you can do to make your home safe and to help prevent falls.  WHAT CAN I DO ON THE OUTSIDE OF MY HOME?  Regularly fix the edges of walkways and driveways and fix any cracks.  Remove anything that might make you trip as you walk through a door, such as a raised step or threshold.  Trim any bushes or trees on the path to your home.  Use bright outdoor lighting.  Clear any walking paths of anything that might make someone trip, such as rocks or tools.  Regularly check to see if handrails are loose or broken. Make sure that both sides of any steps have handrails.  Any raised decks and porches should have guardrails on the edges.  Have any leaves, snow, or ice cleared regularly.  Use sand or salt on walking paths during winter.  Clean up any spills in your garage right away. This includes oil or grease spills. WHAT CAN I DO IN THE BATHROOM?   Use night lights.  Install grab bars by the toilet and in  the tub and shower. Do not use towel bars as grab bars.  Use non-skid mats or decals in the tub or shower.  If you need to sit down in the shower, use a plastic, non-slip stool.  Keep the floor dry. Clean up any water that spills on the floor as soon as it happens.  Remove soap buildup in the tub or shower regularly.  Attach bath mats securely with double-sided non-slip rug tape.  Do not have throw rugs and other things on the floor that can make you trip. WHAT CAN I DO IN THE BEDROOM?  Use night lights.  Make sure that you have a light by your bed that is easy to reach.  Do not use any sheets or blankets that are too big for your bed. They should not hang down onto the floor.  Have a firm chair that has side arms. You can use this for support while you get dressed.  Do not have throw rugs and other things on the floor that can make you trip. WHAT CAN I DO IN THE KITCHEN?  Clean up any spills right away.  Avoid walking on wet floors.  Keep items that you use a lot in easy-to-reach places.  If you need to reach something above you, use a strong  step stool that has a grab bar.  Keep electrical cords out of the way.  Do not use floor polish or wax that makes floors slippery. If you must use wax, use non-skid floor wax.  Do not have throw rugs and other things on the floor that can make you trip. WHAT CAN I DO WITH MY STAIRS?  Do not leave any items on the stairs.  Make sure that there are handrails on both sides of the stairs and use them. Fix handrails that are broken or loose. Make sure that handrails are as long as the stairways.  Check any carpeting to make sure that it is firmly attached to the stairs. Fix any carpet that is loose or worn.  Avoid having throw rugs at the top or bottom of the stairs. If you do have throw rugs, attach them to the floor with carpet tape.  Make sure that you have a light switch at the top of the stairs and the bottom of the stairs. If  you do not have them, ask someone to add them for you. WHAT ELSE CAN I DO TO HELP PREVENT FALLS?  Wear shoes that:  Do not have high heels.  Have rubber bottoms.  Are comfortable and fit you well.  Are closed at the toe. Do not wear sandals.  If you use a stepladder:  Make sure that it is fully opened. Do not climb a closed stepladder.  Make sure that both sides of the stepladder are locked into place.  Ask someone to hold it for you, if possible.  Clearly mark and make sure that you can see:  Any grab bars or handrails.  First and last steps.  Where the edge of each step is.  Use tools that help you move around (mobility aids) if they are needed. These include:  Canes.  Walkers.  Scooters.  Crutches.  Turn on the lights when you go into a dark area. Replace any light bulbs as soon as they burn out.  Set up your furniture so you have a clear path. Avoid moving your furniture around.  If any of your floors are uneven, fix them.  If there are any pets around you, be aware of where they are.  Review your medicines with your doctor. Some medicines can make you feel dizzy. This can increase your chance of falling. Ask your doctor what other things that you can do to help prevent falls.   This information is not intended to replace advice given to you by your health care provider. Make sure you discuss any questions you have with your health care provider.   Document Released: 02/20/2009 Document Revised: 09/10/2014 Document Reviewed: 05/31/2014 Elsevier Interactive Patient Education 2016 [place] Maintenance, Male A healthy lifestyle and preventative care can promote health and wellness.  Maintain regular health, dental, and eye exams.  Eat a healthy diet. Foods like vegetables, fruits, whole grains, low-fat dairy products, and lean protein foods contain the nutrients you need and are low in calories. Decrease your intake of foods high in solid  fats, added sugars, and salt. Get information about a proper diet from your health care provider, if necessary.  Regular physical exercise is one of the most important things you can do for your health. Most adults should get at least 150 minutes of moderate-intensity exercise (any activity that increases your heart rate and causes you to sweat) each week. In addition, most adults need muscle-strengthening exercises on 2 or more  days a week.   Maintain a healthy weight. The body mass index (BMI) is a screening tool to identify possible weight problems. It provides an estimate of body fat based on height and weight. Your health care provider can find your BMI and can help you achieve or maintain a healthy weight. For males 20 years and older:  A BMI below 18.5 is considered underweight.  A BMI of 18.5 to 24.9 is normal.  A BMI of 25 to 29.9 is considered overweight.  A BMI of 30 and above is considered obese.  Maintain normal blood lipids and cholesterol by exercising and minimizing your intake of saturated fat. Eat a balanced diet with plenty of fruits and vegetables. Blood tests for lipids and cholesterol should begin at age 38 and be repeated every 5 years. If your lipid or cholesterol levels are high, you are over age 56, or you are at high risk for heart disease, you may need your cholesterol levels checked more frequently.Ongoing high lipid and cholesterol levels should be treated with medicines if diet and exercise are not working.  If you smoke, find out from your health care provider how to quit. If you do not use tobacco, do not start.  Lung cancer screening is recommended for adults aged 39-80 years who are at high risk for developing lung cancer because of a history of smoking. A yearly low-dose CT scan of the lungs is recommended for people who have at least a 30-pack-year history of smoking and are current smokers or have quit within the past 15 years. A pack year of smoking is  smoking an average of 1 pack of cigarettes a day for 1 year (for example, a 30-pack-year history of smoking could mean smoking 1 pack a day for 30 years or 2 packs a day for 15 years). Yearly screening should continue until the smoker has stopped smoking for at least 15 years. Yearly screening should be stopped for people who develop a health problem that would prevent them from having lung cancer treatment.  If you choose to drink alcohol, do not have more than 2 drinks per day. One drink is considered to be 12 oz (360 mL) of beer, 5 oz (150 mL) of wine, or 1.5 oz (45 mL) of liquor.  Avoid the use of street drugs. Do not share needles with anyone. Ask for help if you need support or instructions about stopping the use of drugs.  High blood pressure causes heart disease and increases the risk of stroke. High blood pressure is more likely to develop in:  People who have blood pressure in the end of the normal range (100-139/85-89 mm Hg).  People who are overweight or obese.  People who are African American.  If you are 26-29 years of age, have your blood pressure checked every 3-5 years. If you are 46 years of age or older, have your blood pressure checked every year. You should have your blood pressure measured twice--once when you are at a hospital or clinic, and once when you are not at a hospital or clinic. Record the average of the two measurements. To check your blood pressure when you are not at a hospital or clinic, you can use:  An automated blood pressure machine at a pharmacy.  A home blood pressure monitor.  If you are 6-19 years old, ask your health care provider if you should take aspirin to prevent heart disease.  Diabetes screening involves taking a blood sample to check your  fasting blood sugar level. This should be done once every 3 years after age 55 if you are at a normal weight and without risk factors for diabetes. Testing should be considered at a younger age or be  carried out more frequently if you are overweight and have at least 1 risk factor for diabetes.  Colorectal cancer can be detected and often prevented. Most routine colorectal cancer screening begins at the age of 43 and continues through age 38. However, your health care provider may recommend screening at an earlier age if you have risk factors for colon cancer. On a yearly basis, your health care provider may provide home test kits to check for hidden blood in the stool. A small camera at the end of a tube may be used to directly examine the colon (sigmoidoscopy or colonoscopy) to detect the earliest forms of colorectal cancer. Talk to your health care provider about this at age 49 when routine screening begins. A direct exam of the colon should be repeated every 5-10 years through age 74, unless early forms of precancerous polyps or small growths are found.  People who are at an increased risk for hepatitis B should be screened for this virus. You are considered at high risk for hepatitis B if:  You were born in a country where hepatitis B occurs often. Talk with your health care provider about which countries are considered high risk.  Your parents were born in a high-risk country and you have not received a shot to protect against hepatitis B (hepatitis B vaccine).  You have HIV or AIDS.  You use needles to inject street drugs.  You live with, or have sex with, someone who has hepatitis B.  You are a man who has sex with other men (MSM).  You get hemodialysis treatment.  You take certain medicines for conditions like cancer, organ transplantation, and autoimmune conditions.  Hepatitis C blood testing is recommended for all people born from [birth year] through 1963 and any individual with known risk factors for hepatitis C.  Healthy men should no longer receive prostate-specific antigen (PSA) blood tests as part of routine cancer screening. Talk to your health care provider about prostate  cancer screening.  Testicular cancer screening is not recommended for adolescents or adult males who have no symptoms. Screening includes self-exam, a health care provider exam, and other screening tests. Consult with your health care provider about any symptoms you have or any concerns you have about testicular cancer.  Practice safe sex. Use condoms and avoid high-risk sexual practices to reduce the spread of sexually transmitted infections (STIs).  You should be screened for STIs, including gonorrhea and chlamydia if:  You are sexually active and are younger than 24 years.  You are older than 24 years, and your health care provider tells you that you are at risk for this type of infection.  Your sexual activity has changed since you were last screened, and you are at an increased risk for chlamydia or gonorrhea. Ask your health care provider if you are at risk.  If you are at risk of being infected with HIV, it is recommended that you take a prescription medicine daily to prevent HIV infection. This is called pre-exposure prophylaxis (PrEP). You are considered at risk if:  You are a man who has sex with other men (MSM).  You are a heterosexual man who is sexually active with multiple partners.  You take drugs by injection.  You are sexually active with a  partner who has HIV.  Talk with your health care provider about whether you are at high risk of being infected with HIV. If you choose to begin PrEP, you should first be tested for HIV. You should then be tested every 3 months for as long as you are taking PrEP.  Use sunscreen. Apply sunscreen liberally and repeatedly throughout the day. You should seek shade when your shadow is shorter than you. Protect yourself by wearing long sleeves, pants, a wide-brimmed hat, and sunglasses year round whenever you are outdoors.  Tell your health care provider of new moles or changes in moles, especially if there is a change in shape or color.  Also, tell your health care provider if a mole is larger than the size of a pencil eraser.  A one-time screening for abdominal aortic aneurysm (AAA) and surgical repair of large AAAs by ultrasound is recommended for men aged 48-75 years who are current or former smokers.  Stay current with your vaccines (immunizations).   This information is not intended to replace advice given to you by your health care provider. Make sure you discuss any questions you have with your health care provider.   Document Released: 10/23/2007 Document Revised: 05/17/2014 Document Reviewed: 09/21/2010 Elsevier Interactive Patient Education [employer].

## 2015-10-20 ENCOUNTER — Other Ambulatory Visit: Payer: Self-pay | Admitting: Family Medicine

## 2015-10-30 ENCOUNTER — Other Ambulatory Visit (INDEPENDENT_AMBULATORY_CARE_PROVIDER_SITE_OTHER): Payer: Medicare Other

## 2015-10-30 DIAGNOSIS — Z0000 Encounter for general adult medical examination without abnormal findings: Secondary | ICD-10-CM | POA: Diagnosis not present

## 2015-10-30 LAB — CBC WITH DIFFERENTIAL/PLATELET
Basophils Absolute: 0 10*3/uL (ref 0.0–0.1)
Basophils Relative: 0.6 % (ref 0.0–3.0)
Eosinophils Absolute: 0.1 10*3/uL (ref 0.0–0.7)
Eosinophils Relative: 1.8 % (ref 0.0–5.0)
HCT: 44.7 % (ref 39.0–52.0)
HEMOGLOBIN: 14.7 g/dL (ref 13.0–17.0)
LYMPHS ABS: 2.3 10*3/uL (ref 0.7–4.0)
Lymphocytes Relative: 33.9 % (ref 12.0–46.0)
MCHC: 32.9 g/dL (ref 30.0–36.0)
MCV: 94.6 fl (ref 78.0–100.0)
MONO ABS: 0.5 10*3/uL (ref 0.1–1.0)
Monocytes Relative: 7.6 % (ref 3.0–12.0)
NEUTROS PCT: 56.1 % (ref 43.0–77.0)
Neutro Abs: 3.8 10*3/uL (ref 1.4–7.7)
Platelets: 155 10*3/uL (ref 150.0–400.0)
RBC: 4.73 Mil/uL (ref 4.22–5.81)
RDW: 15.1 % (ref 11.5–15.5)
WBC: 6.8 10*3/uL (ref 4.0–10.5)

## 2015-10-30 LAB — POC URINALSYSI DIPSTICK (AUTOMATED)
BILIRUBIN UA: NEGATIVE
GLUCOSE UA: NEGATIVE
KETONES UA: NEGATIVE
Leukocytes, UA: NEGATIVE
NITRITE UA: NEGATIVE
Protein, UA: NEGATIVE
RBC UA: NEGATIVE
Spec Grav, UA: 1.02
Urobilinogen, UA: 1
pH, UA: 7

## 2015-10-30 LAB — LIPID PANEL
CHOL/HDL RATIO: 2
CHOLESTEROL: 113 mg/dL (ref 0–200)
HDL: 51.9 mg/dL (ref >39.00)
LDL CALC: 46 mg/dL (ref 0–99)
NONHDL: 60.86
Triglycerides: 75 mg/dL (ref 0.0–149.0)
VLDL: 15 mg/dL (ref 0.0–40.0)

## 2015-10-30 LAB — BASIC METABOLIC PANEL
BUN: 22 mg/dL (ref 6–23)
CHLORIDE: 102 meq/L (ref 96–112)
CO2: 31 mEq/L (ref 19–32)
CREATININE: 1.32 mg/dL (ref 0.40–1.50)
Calcium: 9.5 mg/dL (ref 8.4–10.5)
GFR: 55.24 mL/min — AB (ref >60.00)
Glucose, Bld: 92 mg/dL (ref 70–99)
Potassium: 4.7 mEq/L (ref 3.5–5.1)
Sodium: 138 mEq/L (ref 135–145)

## 2015-10-30 LAB — PSA: PSA: 0.73 ng/mL (ref 0.10–4.00)

## 2015-10-30 LAB — HEPATIC FUNCTION PANEL
ALK PHOS: 64 U/L (ref 39–117)
ALT: 17 U/L (ref 0–53)
AST: 17 U/L (ref 0–37)
Albumin: 4 g/dL (ref 3.5–5.2)
BILIRUBIN DIRECT: 0.1 mg/dL (ref 0.0–0.3)
BILIRUBIN TOTAL: 0.6 mg/dL (ref 0.2–1.2)
Total Protein: 6.6 g/dL (ref 6.0–8.3)

## 2015-10-30 LAB — TSH: TSH: 6.98 u[IU]/mL — AB (ref 0.35–4.50)

## 2015-10-31 NOTE — Progress Notes (Signed)
I reviewed and agree with the content of this visit.

## 2015-11-06 ENCOUNTER — Ambulatory Visit (INDEPENDENT_AMBULATORY_CARE_PROVIDER_SITE_OTHER): Payer: Medicare Other | Admitting: Adult Health

## 2015-11-06 ENCOUNTER — Encounter: Payer: Self-pay | Admitting: Adult Health

## 2015-11-06 VITALS — BP 150/80 | Temp 98.0°F | Ht 68.0 in | Wt 147.3 lb

## 2015-11-06 DIAGNOSIS — K219 Gastro-esophageal reflux disease without esophagitis: Secondary | ICD-10-CM

## 2015-11-06 DIAGNOSIS — E038 Other specified hypothyroidism: Secondary | ICD-10-CM

## 2015-11-06 DIAGNOSIS — I2510 Atherosclerotic heart disease of native coronary artery without angina pectoris: Secondary | ICD-10-CM

## 2015-11-06 DIAGNOSIS — G250 Essential tremor: Secondary | ICD-10-CM

## 2015-11-06 DIAGNOSIS — E039 Hypothyroidism, unspecified: Secondary | ICD-10-CM

## 2015-11-06 DIAGNOSIS — I10 Essential (primary) hypertension: Secondary | ICD-10-CM

## 2015-11-06 DIAGNOSIS — E785 Hyperlipidemia, unspecified: Secondary | ICD-10-CM | POA: Diagnosis not present

## 2015-11-06 MED ORDER — LISINOPRIL 2.5 MG PO TABS: ORAL_TABLET | ORAL | Status: DC

## 2015-11-06 MED ORDER — PRIMIDONE 250 MG PO TABS: 250.0000 mg | ORAL_TABLET | Freq: Every day | ORAL | Status: DC

## 2015-11-06 MED ORDER — CARVEDILOL 12.5 MG PO TABS: ORAL_TABLET | ORAL | Status: DC

## 2015-11-06 NOTE — Patient Instructions (Signed)
It was great seeing you again  Your exam was great! Keep doing whatever you are doing  I have increased your Primidone for the tremors. We have gone from 50 mg to 250mg , continue to take at night.   Let me know in a couple of weeks how you are doing   Follow up in 6 months

## 2015-11-06 NOTE — Progress Notes (Signed)
Subjective:    Patient ID: [name] [name], male    DOB: 1932-04-25, 82 y.o.   MRN: [id number]  HPI Calbert is a 82 year old married male nonsmoker who comes in today for general physical examination because of history of hyperlipidemia, cardiomyopathy, coronary artery disease, reflux esophagitis  He takes Lipitor 80 mg daily and an aspirin tablet because of a history of hyperlipidemia.   He takes cored 12.5 mg twice a day and lisinopril 2.5 mg twice a day because of a history of heart failure and HTN. He has history of coronary disease but asymptomatic  He takes OTC Zantac  BP here today 150/70 BP home 1 at home 120/72. He says he is not lightheaded when he stands up  He gets routine eye care, dental care, never had a colonoscopy. We did a flexible sigmoidoscopy about 20 years ago was normal. Asymptomatic no family history of colon cancer.   He continues to complain of tremor. Last month we started him on Primidone. He felt as though it was working but he continues to have a tremor  Vaccinations up-to-date   Cognitive function normal he walks an hour a day. home health safety reviewed no issues identified, no guns in the house, he does have a healthcare power of attorney and living well   Review of Systems  Constitutional: Negative.   HENT: Negative.   Eyes: Negative.   Respiratory: Negative.   Cardiovascular: Negative.   Gastrointestinal: Negative.   Endocrine: Negative.   Genitourinary: Negative.   Musculoskeletal: Positive for arthralgias. Negative for myalgias, joint swelling and gait problem.  Allergic/Immunologic: Negative.   Neurological: Positive for tremors.  Hematological: Negative.   Psychiatric/Behavioral: Negative.   All other systems reviewed and are negative.  Past Medical History  Diagnosis Date  . Hypertension   . CAD (coronary artery disease)   . Cardiomyopathy   . GERD (gastroesophageal reflux disease)   . UTI   . RESPIRATORY FAILURE   .  HYPERLIPIDEMIA   . Allergy   . Myocardial infarction ([place]) 2004  . BPH (benign prostatic hyperplasia)   . Skin cancer     unknown if BCC or SCC  . Tremor     Social History   Social History  . Marital Status: Married    Spouse Name: N/A  . Number of Children: N/A  . Years of Education: N/A   Occupational History  . Not on file.   Social History Main Topics  . Smoking status: Former Smoker -- 1.00 packs/day for 30 years    Types: Cigarettes    Quit date: 07/27/1980  . Smokeless tobacco: Not on file  . Alcohol Use: No  . Drug Use: No  . Sexual Activity: Yes    Birth Control/ Protection: None   Other Topics Concern  . Not on file   Social History Narrative   Retired from working in Office manager   Married for 58 years    He likes to walk and read          Past Surgical History  Procedure Laterality Date  . Tonsillectomy    . Cardiac catheterization  04-2003    stents  . Cataract extraction Bilateral     Family History  Problem Relation Age of Onset  . Coronary artery disease Father   . Hypertension Father   . Hypertension Father   . Heart attack Father   . Skin cancer Father     Allergies  Allergen Reactions  . Sulfonamide Derivatives  REACTION: history    Current Outpatient Prescriptions on File Prior to Visit  Medication Sig Dispense Refill  . aspirin 325 MG EC tablet Take 325 mg by mouth daily.      Marland Kitchen atorvastatin (LIPITOR) 80 MG tablet TAKE 1 TABLET BY MOUTH EVERY DAY 100 tablet 1  . nitroGLYCERIN (NITROSTAT) 0.4 MG SL tablet Place 1 tablet (0.4 mg total) under the tongue every 5 (five) minutes as needed. 25 tablet 1  . NON FORMULARY Ultra flora plus  daily     . ranitidine (ZANTAC) 150 MG capsule TAKE ONE CAPSULE BY MOUTH EVERY EVENING 100 capsule 3   No current facility-administered medications on file prior to visit.    BP 150/80 mmHg  Temp(Src) 98 F (36.7 C) (Oral)  Ht 5\' 8"  (1.727 m)  Wt 147 lb 4.8 oz (66.815 kg)  BMI  22.40 kg/m2       Objective:   Physical Exam  Constitutional: He is oriented to person, place, and time. He appears well-developed and well-nourished. No distress.  HENT:  Head: Normocephalic and atraumatic.  Right Ear: External ear normal.  Left Ear: External ear normal.  Nose: Nose normal.  Mouth/Throat: Oropharynx is clear and moist. No oropharyngeal exudate.  Eyes: Conjunctivae and EOM are normal. Pupils are equal, round, and reactive to light. Right eye exhibits no discharge. Left eye exhibits no discharge.  Neck: Normal range of motion. Neck supple. No JVD present. No tracheal deviation present. No thyromegaly present.  Cardiovascular: Normal rate, regular rhythm, normal heart sounds and intact distal pulses.  Exam reveals no gallop.   No murmur heard. Pulmonary/Chest: Effort normal and breath sounds normal. No stridor. No respiratory distress. He has no wheezes. He has no rales. He exhibits no tenderness.  Abdominal: Soft. Bowel sounds are normal. He exhibits no distension and no mass. There is no tenderness. There is no rebound and no guarding.  Genitourinary:  Refused  Musculoskeletal: Normal range of motion. He exhibits no edema or tenderness.  Lymphadenopathy:    He has no cervical adenopathy.  Neurological: He is alert and oriented to person, place, and time. He has normal reflexes. He displays normal reflexes. No cranial nerve deficit. He exhibits normal muscle tone. Coordination normal.  Tremors in bilateral upper extremities  Skin: Skin is warm and dry. No rash noted. He is not diaphoretic. No erythema. No pallor.  Psychiatric: He has a normal mood and affect. His behavior is normal. Judgment and thought content normal.  Nursing note and vitals reviewed.     Assessment & Plan:  1. Essential tremor - Will increase primidone - primidone (MYSOLINE) 250 MG tablet; Take 1 tablet (250 mg total) by mouth at bedtime.  Dispense: 30 tablet; Refill: 3 - Follow up in 2 weeks    2. Essential hypertension - Controlled at home on current medication  - No change - Continue to monitor at home - Follow up in 6 months or sooner if needed 3. Hyperlipidemia - Well controlled on Lipitor 80mg    4. Gastroesophageal reflux disease without esophagitis - Controlled with OTC zantec  5. Subclinical hypothyroidism - Continues to be slightly high at 6.98 - Asymptomatic - Will continue to monitor - Follow up in 6 months.   Dorothyann Peng, NP

## 2015-11-21 ENCOUNTER — Telehealth: Payer: Self-pay | Admitting: Adult Health

## 2015-11-21 NOTE — Telephone Encounter (Signed)
Left message for patient to return phone call.  

## 2015-11-21 NOTE — Telephone Encounter (Signed)
Pt states he was to call back in 2 weeks to give [facility] update on taking primidone (MYSOLINE) 250 MG tablet

## 2015-11-25 NOTE — Telephone Encounter (Signed)
Patient states that he is feeling "okay" on this medicine. He states that he does get dizzy from time to time, and does notice some depression - but overall, he does notice an improvement with the medication. Patient states he just wanted to let [facility] know.

## 2016-01-16 DIAGNOSIS — H5203 Hypermetropia, bilateral: Secondary | ICD-10-CM | POA: Diagnosis not present

## 2016-01-16 DIAGNOSIS — H26491 Other secondary cataract, right eye: Secondary | ICD-10-CM | POA: Diagnosis not present

## 2016-01-16 DIAGNOSIS — H02055 Trichiasis without entropian left lower eyelid: Secondary | ICD-10-CM | POA: Diagnosis not present

## 2016-01-16 DIAGNOSIS — Z961 Presence of intraocular lens: Secondary | ICD-10-CM | POA: Diagnosis not present

## 2016-02-03 ENCOUNTER — Ambulatory Visit (INDEPENDENT_AMBULATORY_CARE_PROVIDER_SITE_OTHER): Payer: Medicare Other

## 2016-02-03 DIAGNOSIS — Z23 Encounter for immunization: Secondary | ICD-10-CM

## 2016-03-06 ENCOUNTER — Other Ambulatory Visit: Payer: Self-pay | Admitting: Adult Health

## 2016-03-06 DIAGNOSIS — G250 Essential tremor: Secondary | ICD-10-CM

## 2016-03-08 NOTE — Telephone Encounter (Signed)
Ok to refill for 90+3 

## 2016-03-15 DIAGNOSIS — L918 Other hypertrophic disorders of the skin: Secondary | ICD-10-CM | POA: Diagnosis not present

## 2016-03-15 DIAGNOSIS — L853 Xerosis cutis: Secondary | ICD-10-CM | POA: Diagnosis not present

## 2016-03-15 DIAGNOSIS — L821 Other seborrheic keratosis: Secondary | ICD-10-CM | POA: Diagnosis not present

## 2016-03-15 DIAGNOSIS — D225 Melanocytic nevi of trunk: Secondary | ICD-10-CM | POA: Diagnosis not present

## 2016-03-15 DIAGNOSIS — L570 Actinic keratosis: Secondary | ICD-10-CM | POA: Diagnosis not present

## 2016-04-10 ENCOUNTER — Other Ambulatory Visit: Payer: Self-pay | Admitting: Adult Health

## 2016-04-14 ENCOUNTER — Telehealth: Payer: Self-pay | Admitting: Adult Health

## 2016-04-14 NOTE — Telephone Encounter (Signed)
See below

## 2016-04-14 NOTE — Telephone Encounter (Signed)
° ° °  Pt call to say that he would like to no longer take the below med because of the side effect. Pt would like a call back  primidone (MYSOLINE) 250 MG tablet

## 2016-04-15 NOTE — Telephone Encounter (Signed)
What side effects is he having? He has been taking this medication for awhile now, did the side effects just show up?   I would titrate down on this medication slowly if he wants to quit taking it.   Would he like to follow up with neurology for the tremors?

## 2016-04-15 NOTE — Telephone Encounter (Signed)
I contacted patient, but he states he would like to speak with [name] about this issue. Will route to [facility] for him to discuss with patient.

## 2016-04-15 NOTE — Telephone Encounter (Signed)
Spoke to [employer] and he advised me that he feels as though the Primidone is not controlling his tremors as well as it used to. He also feels as though it is making him lightheaded and dizzy. He has not had any falls. He would like to come off this medication  Advised him to ween himself off slowly over the next month. If he experiencing any side effects to call back.   He does not want to see neurology at this time.

## 2016-06-03 NOTE — Progress Notes (Signed)
HPI: FU coronary artery disease, ischemic cardiomyopathy, and hyperlipidemia. He had a myocardial infarction in December [id number] complicated by ventricular fibrillation arrest. He had PCI of his LAD at that time. His most recent Myoview in March of 2013 showed an ejection fraction of 30%. There was a prior anterior and apical infarct, but there was no ischemia. He has declined ICD previously. Previous carotid Dopplers in July of 2006 showed 0-39% bilateral stenosis. Abdominal ultrasound at that time showed no aneurysm. Since I last saw him, he denies dyspnea, chest pain, palpitations, syncope or pedal edema.  Current Outpatient Prescriptions  Medication Sig Dispense Refill  . aspirin 325 MG EC tablet Take 325 mg by mouth daily.      Marland Kitchen atorvastatin (LIPITOR) 80 MG tablet TAKE 1 TABLET BY MOUTH EVERY DAY 100 tablet 0  . carvedilol (COREG) 12.5 MG tablet TAKE 1 TABLET (12.5 MG TOTAL) BY MOUTH 2 (TWO) TIMES DAILY WITH A MEAL. 200 tablet 3  . lisinopril (PRINIVIL,ZESTRIL) 2.5 MG tablet TAKE 1 TABLET (2.5 MG TOTAL) BY MOUTH 2 (TWO) TIMES DAILY AT 10 AM AND 5 PM. 180 tablet 3  . nitroGLYCERIN (NITROSTAT) 0.4 MG SL tablet Place 1 tablet (0.4 mg total) under the tongue every 5 (five) minutes as needed. 25 tablet 1  . NON FORMULARY Ultra flora plus  daily     . primidone (MYSOLINE) 250 MG tablet TAKE 1 TABLET (250 MG TOTAL) BY MOUTH AT BEDTIME. 90 tablet 3  . ranitidine (ZANTAC) 150 MG capsule TAKE ONE CAPSULE BY MOUTH EVERY EVENING 100 capsule 3   No current facility-administered medications for this visit.      Past Medical History:  Diagnosis Date  . Allergy   . BPH (benign prostatic hyperplasia)   . CAD (coronary artery disease)   . Cardiomyopathy   . GERD (gastroesophageal reflux disease)   . HYPERLIPIDEMIA   . Hypertension   . Myocardial infarction 2004  . RESPIRATORY FAILURE   . Skin cancer    unknown if BCC or SCC  . Tremor   . UTI     Past Surgical History:  Procedure  Laterality Date  . CARDIAC CATHETERIZATION  04-2003   stents  . CATARACT EXTRACTION Bilateral   . TONSILLECTOMY      Social History   Social History  . Marital status: Married    Spouse name: N/A  . Number of children: N/A  . Years of education: N/A   Occupational History  . Not on file.   Social History Main Topics  . Smoking status: Former Smoker    Packs/day: 1.00    Years: 30.00    Types: Cigarettes    Quit date: 07/27/1980  . Smokeless tobacco: Never Used  . Alcohol use No  . Drug use: No  . Sexual activity: Yes    Birth control/ protection: None   Other Topics Concern  . Not on file   Social History Narrative   Retired from working in Office manager   Married for 58 years    He likes to walk and read          Family History  Problem Relation Age of Onset  . Coronary artery disease Father   . Hypertension Father   . Heart attack Father   . Skin cancer Father     ROS: complains of fatigue but no fevers or chills, productive cough, hemoptysis, dysphasia, odynophagia, melena, hematochezia, dysuria, hematuria, rash, seizure activity, orthopnea, PND, pedal edema, claudication.  Remaining systems are negative.  Physical Exam: Well-developed frail in no acute distress.  Skin is warm and dry.  HEENT is normal.  Neck is supple.  Chest is clear to auscultation with normal expansion.  Cardiovascular exam is regular rate and rhythm.  Abdominal exam nontender or distended. No masses palpated. Extremities show no edema. neuro grossly intact; intention tremor noted  ECG-Sinus rhythm at a rate of 65. Septal infarct.  A/P  1 Coronary artery disease-continue aspirin and statin.  2 ischemic cardiomyopathy-continue ACE inhibitor and beta blocker. Note he has had problems with dizziness with advancing his cardiac medications previously. He previously has declined ICD and understands risk of sudden death.   3 hypertension-blood pressure mildly elevated but  typically controlled at home. Continue present medications. Potassium and renal function monitored by primary care.   4 hyperlipidemia-continue statin. Lipids and liver monitored by primary care.    Kirk Ruths, MD

## 2016-06-11 ENCOUNTER — Encounter: Payer: Self-pay | Admitting: Cardiology

## 2016-06-11 ENCOUNTER — Ambulatory Visit (INDEPENDENT_AMBULATORY_CARE_PROVIDER_SITE_OTHER): Payer: Medicare Other | Admitting: Cardiology

## 2016-06-11 VITALS — BP 142/80 | HR 65 | Ht 67.0 in | Wt 143.4 lb

## 2016-06-11 DIAGNOSIS — I2510 Atherosclerotic heart disease of native coronary artery without angina pectoris: Secondary | ICD-10-CM | POA: Diagnosis not present

## 2016-06-11 DIAGNOSIS — E7800 Pure hypercholesterolemia, unspecified: Secondary | ICD-10-CM | POA: Diagnosis not present

## 2016-06-11 DIAGNOSIS — I10 Essential (primary) hypertension: Secondary | ICD-10-CM | POA: Diagnosis not present

## 2016-06-11 NOTE — Patient Instructions (Signed)
Your physician wants you to follow-up in: ONE YEAR WITH DR CRENSHAW You will receive a reminder letter in the mail two months in advance. If you don't receive a letter, please call our office to schedule the follow-up appointment.   If you need a refill on your cardiac medications before your next appointment, please call your pharmacy.  

## 2016-07-12 ENCOUNTER — Other Ambulatory Visit: Payer: Self-pay | Admitting: Dermatology

## 2016-07-12 DIAGNOSIS — L929 Granulomatous disorder of the skin and subcutaneous tissue, unspecified: Secondary | ICD-10-CM | POA: Diagnosis not present

## 2016-07-12 DIAGNOSIS — L080 Pyoderma: Secondary | ICD-10-CM | POA: Diagnosis not present

## 2016-07-12 DIAGNOSIS — D485 Neoplasm of uncertain behavior of skin: Secondary | ICD-10-CM | POA: Diagnosis not present

## 2016-08-05 ENCOUNTER — Other Ambulatory Visit: Payer: Self-pay | Admitting: Adult Health

## 2016-09-22 ENCOUNTER — Telehealth: Payer: Self-pay

## 2016-09-22 NOTE — Telephone Encounter (Signed)
Call to [name] [name] and schedule AWV for June 13th at 1pm and her spouse [name] [name] at 2pm but will see together

## 2016-10-19 NOTE — Progress Notes (Signed)
Subjective:   [name] [name] is a 83 y.o. male who presents for Medicare Annual/Subsequent preventive examination.  He has weaned himself off of Primidone per PCP advice (see 04/14/16 phone note).   Review of Systems:  No ROS.  Medicare Wellness Visit. Additional risk factors are reflected in the social history.  Cardiac Risk Factors include: advanced age (>74men, >6 women);dyslipidemia;hypertension;male gender;family history of premature cardiovascular disease  Sleep patterns: No sleep issues; sleeps 3-4 hours and then wakes up for a bit and then goes to back sleep and sleeps until it's time to get up. Gets up 0-1 times nightly to void. Home Safety/Smoke Alarms: Feels safe in home. Smoke alarms in place. Security system in place.  Living environment; residence and Firearm Safety: Lives w/ wife. 1-story house/ trailer, firearms stored safely. Seat Belt Safety/Bike Helmet: Wears seat belt.   Counseling:   Eye Exam- Follows w/ Dr. Ellie Lunch  Dental- Follows w/ Dr. Otila Kluver  Male:   CCS- Aged out.     PSA-  Lab Results  Component Value Date   PSA 0.73 10/30/2015   PSA 0.64 09/19/2014   PSA 1.07 08/29/2013       Objective:    Vitals: BP 134/72   Pulse 64   Ht 5\' 7"  (1.702 m)   Wt 145 lb 11.2 oz (66.1 kg)   SpO2 98%   BMI 22.82 kg/m   Body mass index is 22.82 kg/m.  Wt Readings from Last 3 Encounters:  10/20/16 145 lb 11.2 oz (66.1 kg)  06/11/16 143 lb 6.4 oz (65 kg)  11/06/15 147 lb 4.8 oz (66.8 kg)   Tobacco History  Smoking Status  . Former Smoker  . Packs/day: 1.00  . Years: 30.00  . Types: Cigarettes  . Quit date: 07/27/1980  Smokeless Tobacco  . Never Used     Counseling given: Not Answered   Past Medical History:  Diagnosis Date  . Allergy   . BPH (benign prostatic hyperplasia)   . CAD (coronary artery disease)   . Cardiomyopathy   . GERD (gastroesophageal reflux disease)   . HYPERLIPIDEMIA   . Hypertension   . Myocardial infarction ([place]) 2004    . RESPIRATORY FAILURE   . Skin cancer    unknown if BCC or SCC  . Tremor   . UTI    Past Surgical History:  Procedure Laterality Date  . CARDIAC CATHETERIZATION  04-2003   stents  . CATARACT EXTRACTION Bilateral   . TONSILLECTOMY     Family History  Problem Relation Age of Onset  . Coronary artery disease Father   . Hypertension Father   . Heart attack Father   . Skin cancer Father    History  Sexual Activity  . Sexual activity: No    Outpatient Encounter Prescriptions as of 10/20/2016  Medication Sig  . aspirin 325 MG EC tablet Take 325 mg by mouth daily.    Marland Kitchen atorvastatin (LIPITOR) 80 MG tablet TAKE 1 TABLET BY MOUTH EVERY DAY  . carvedilol (COREG) 12.5 MG tablet TAKE 1 TABLET (12.5 MG TOTAL) BY MOUTH 2 (TWO) TIMES DAILY WITH A MEAL.  Marland Kitchen lisinopril (PRINIVIL,ZESTRIL) 2.5 MG tablet TAKE 1 TABLET (2.5 MG TOTAL) BY MOUTH 2 (TWO) TIMES DAILY AT 10 AM AND 5 PM.  . nitroGLYCERIN (NITROSTAT) 0.4 MG SL tablet Place 1 tablet (0.4 mg total) under the tongue every 5 (five) minutes as needed.  . ranitidine (ZANTAC) 150 MG capsule TAKE ONE CAPSULE BY MOUTH EVERY EVENING  .  primidone (MYSOLINE) 250 MG tablet TAKE 1 TABLET (250 MG TOTAL) BY MOUTH AT BEDTIME. (Patient not taking: Reported on 10/20/2016)  . [DISCONTINUED] NON FORMULARY Ultra flora plus  daily    No facility-administered encounter medications on file as of 10/20/2016.     Activities of Daily Living In your present state of health, do you have any difficulty performing the following activities: 10/20/2016  Hearing? N  Vision? N  Difficulty concentrating or making decisions? N  Walking or climbing stairs? N  Dressing or bathing? N  Doing errands, shopping? N  Preparing Food and eating ? N  Using the Toilet? N  In the past six months, have you accidently leaked urine? N  Do you have problems with loss of bowel control? N  Managing your Medications? N  Managing your Finances? N  Housekeeping or managing your  Housekeeping? N  Some recent data might be hidden    Patient Care Team: Dorothyann Peng, NP as PCP - General (Family Medicine) Luberta Mutter, MD as Consulting Physician (Ophthalmology) Stanford Breed Denice Bors, MD as Consulting Physician (Cardiology) Katy Apo, MD as Consulting Physician (Ophthalmology) Druscilla Brownie, MD as Consulting Physician (Dermatology) Janann August, MD as Consulting Physician (Dermatology) Garry Heater, DDS as Consulting Physician (Dentistry)   Assessment:    Physical assessment deferred to PCP.  Exercise Activities and Dietary recommendations Current Exercise Habits: Home exercise routine, Type of exercise: walking, Time (Minutes): 45, Frequency (Times/Week): 6, Weekly Exercise (Minutes/Week): 270  Diet (meal preparation, eat out, water intake, caffeinated beverages, dairy products, fruits and vegetables): in general, a "healthy" diet  , well balanced, on average, 3 meals per day. Drinks 1 Diet Pepsi across a span of about 4 days. Drinks water throughout the day.     Goals    . Maintain current health. Continue walking daily.      Fall Risk Fall Risk  10/20/2016 10/17/2015 09/24/2014 08/29/2013  Falls in the past year? No No No No   Depression Screen PHQ 2/9 Scores 10/20/2016 10/17/2015 09/24/2014 08/29/2013  PHQ - 2 Score 3 0 0 0  PHQ- 9 Score 3 - - -    Cognitive Function MMSE - Mini Mental State Exam 10/20/2016 10/17/2015  Not completed: - (No Data)  Orientation to time 4 -  Orientation to Place 5 -  Registration 3 -  Attention/ Calculation 5 -  Recall 3 -  Language- name 2 objects 2 -  Language- repeat 1 -  Language- follow 3 step command 3 -  Language- read & follow direction 1 -  Write a sentence 1 -  Copy design 1 -  Total score 29 -        Immunization History  Administered Date(s) Administered  . H1N1 04/16/2008  . Influenza Split 02/10/2011, 01/28/2012  . Influenza Whole 02/17/2007, 02/13/2008, 02/13/2009, 02/12/2010  .  Influenza, High Dose Seasonal PF 02/14/2013, 02/11/2015, 02/03/2016  . Influenza,inj,Quad PF,36+ Mos 02/01/2014  . Pneumococcal Conjugate-13 08/29/2013  . Pneumococcal Polysaccharide-23 05/30/2002, 04/16/2008  . Td 05/30/2002  . Tetanus 08/15/2012  . Zoster 04/21/2006   Screening Tests Health Maintenance  Topic Date Due  . INFLUENZA VACCINE  12/08/2016  . TETANUS/TDAP  08/16/2022  . PNA vac Low Risk Adult  Completed      Plan:    Follow-up w/ PCP as scheduled.  Bring a copy of your advance directives to your next office visit.  Continue to eat heart healthy diet (full of fruits, vegetables, whole grains, lean protein, water--limit salt, fat, and sugar  intake) and increase physical activity as tolerated.  I have personally reviewed and noted the following in the patient's chart:   . Medical and social history . Use of alcohol, tobacco or illicit drugs  . Current medications and supplements . Functional ability and status . Nutritional status . Physical activity . Advanced directives . List of other physicians . Vitals . Screenings to include cognitive, depression, and falls . Referrals and appointments  In addition, I have reviewed and discussed with patient certain preventive protocols, quality metrics, and best practice recommendations. A written personalized care plan for preventive services as well as general preventive health recommendations were provided to patient.     Dorrene German, RN  10/20/2016

## 2016-10-20 ENCOUNTER — Ambulatory Visit (INDEPENDENT_AMBULATORY_CARE_PROVIDER_SITE_OTHER): Payer: Medicare Other

## 2016-10-20 VITALS — BP 134/72 | HR 64 | Ht 67.0 in | Wt 145.7 lb

## 2016-10-20 DIAGNOSIS — Z0000 Encounter for general adult medical examination without abnormal findings: Secondary | ICD-10-CM | POA: Diagnosis not present

## 2016-10-20 NOTE — Patient Instructions (Signed)
[name] , Thank you for taking time to come for your Medicare Wellness Visit. I appreciate your ongoing commitment to your health goals. Please review the following plan we discussed and let me know if I can assist you in the future.   Bring a copy of your advance directives to your next office visit.  Consider the new shingles vaccine, Shingrix. This is available at most pharmacies.   These are the goals we discussed: Goals    . Maintain current health. Continue walking daily.       This is a list of the screening recommended for you and due dates:  Health Maintenance  Topic Date Due  . Flu Shot  12/08/2016  . Tetanus Vaccine  08/16/2022  . Pneumonia vaccines  Completed    Preventive Care 46 Years and Older, Male Preventive care refers to lifestyle choices and visits with your health care provider that can promote health and wellness. What does preventive care include?  A yearly physical exam. This is also called an annual well check.  Dental exams once or twice a year.  Routine eye exams. Ask your health care provider how often you should have your eyes checked.  Personal lifestyle choices, including: ? Daily care of your teeth and gums. ? Regular physical activity. ? Eating a healthy diet. ? Avoiding tobacco and drug use. ? Limiting alcohol use. ? Practicing safe sex. ? Taking low doses of aspirin every day. ? Taking vitamin and mineral supplements as recommended by your health care provider. What happens during an annual well check? The services and screenings done by your health care provider during your annual well check will depend on your age, overall health, lifestyle risk factors, and family history of disease. Counseling Your health care provider may ask you questions about your:  Alcohol use.  Tobacco use.  Drug use.  Emotional well-being.  Home and relationship well-being.  Sexual activity.  Eating habits.  History of falls.  Memory and  ability to understand (cognition).  Work and work Statistician.  Screening You may have the following tests or measurements:  Height, weight, and BMI.  Blood pressure.  Lipid and cholesterol levels. These may be checked every 5 years, or more frequently if you are over 54 years old.  Skin check.  Lung cancer screening. You may have this screening every year starting at age 2 if you have a 30-pack-year history of smoking and currently smoke or have quit within the past 15 years.  Fecal occult blood test (FOBT) of the stool. You may have this test every year starting at age 4.  Flexible sigmoidoscopy or colonoscopy. You may have a sigmoidoscopy every 5 years or a colonoscopy every 10 years starting at age 78.  Prostate cancer screening. Recommendations will vary depending on your family history and other risks.  Hepatitis C blood test.  Hepatitis B blood test.  Sexually transmitted disease (STD) testing.  Diabetes screening. This is done by checking your blood sugar (glucose) after you have not eaten for a while (fasting). You may have this done every 1-3 years.  Abdominal aortic aneurysm (AAA) screening. You may need this if you are a current or former smoker.  Osteoporosis. You may be screened starting at age 47 if you are at high risk.  Talk with your health care provider about your test results, treatment options, and if necessary, the need for more tests. Vaccines Your health care provider may recommend certain vaccines, such as:  Influenza vaccine. This  is recommended every year.  Tetanus, diphtheria, and acellular pertussis (Tdap, Td) vaccine. You may need a Td booster every 10 years.  Varicella vaccine. You may need this if you have not been vaccinated.  Zoster vaccine. You may need this after age 67.  Measles, mumps, and rubella (MMR) vaccine. You may need at least one dose of MMR if you were born in 1957 or later. You may also need a second  dose.  Pneumococcal 13-valent conjugate (PCV13) vaccine. One dose is recommended after age 38.  Pneumococcal polysaccharide (PPSV23) vaccine. One dose is recommended after age 33.  Meningococcal vaccine. You may need this if you have certain conditions.  Hepatitis A vaccine. You may need this if you have certain conditions or if you travel or work in places where you may be exposed to hepatitis A.  Hepatitis B vaccine. You may need this if you have certain conditions or if you travel or work in places where you may be exposed to hepatitis B.  Haemophilus influenzae type b (Hib) vaccine. You may need this if you have certain risk factors.  Talk to your health care provider about which screenings and vaccines you need and how often you need them. This information is not intended to replace advice given to you by your health care provider. Make sure you discuss any questions you have with your health care provider. Document Released: 05/23/2015 Document Revised: 01/14/2016 Document Reviewed: 02/25/2015 Elsevier Interactive Patient Education  2017 [employer].

## 2016-10-20 NOTE — Progress Notes (Signed)
I have reviewed and agree with this plan  

## 2016-11-04 ENCOUNTER — Other Ambulatory Visit: Payer: Self-pay | Admitting: Adult Health

## 2016-11-11 ENCOUNTER — Ambulatory Visit (INDEPENDENT_AMBULATORY_CARE_PROVIDER_SITE_OTHER): Payer: Medicare Other | Admitting: Adult Health

## 2016-11-11 ENCOUNTER — Encounter: Payer: Self-pay | Admitting: Adult Health

## 2016-11-11 VITALS — BP 120/70 | Temp 97.7°F | Ht 67.0 in | Wt 145.4 lb

## 2016-11-11 DIAGNOSIS — I10 Essential (primary) hypertension: Secondary | ICD-10-CM | POA: Diagnosis not present

## 2016-11-11 DIAGNOSIS — I2510 Atherosclerotic heart disease of native coronary artery without angina pectoris: Secondary | ICD-10-CM

## 2016-11-11 DIAGNOSIS — G250 Essential tremor: Secondary | ICD-10-CM | POA: Diagnosis not present

## 2016-11-11 DIAGNOSIS — Z0000 Encounter for general adult medical examination without abnormal findings: Secondary | ICD-10-CM | POA: Diagnosis not present

## 2016-11-11 DIAGNOSIS — E7800 Pure hypercholesterolemia, unspecified: Secondary | ICD-10-CM | POA: Diagnosis not present

## 2016-11-11 LAB — CBC WITH DIFFERENTIAL/PLATELET
BASOS ABS: 0 10*3/uL (ref 0.0–0.1)
Basophils Relative: 0.6 % (ref 0.0–3.0)
EOS ABS: 0.2 10*3/uL (ref 0.0–0.7)
Eosinophils Relative: 2.3 % (ref 0.0–5.0)
HCT: 45.1 % (ref 39.0–52.0)
Hemoglobin: 15.1 g/dL (ref 13.0–17.0)
LYMPHS ABS: 1.8 10*3/uL (ref 0.7–4.0)
Lymphocytes Relative: 26.5 % (ref 12.0–46.0)
MCHC: 33.5 g/dL (ref 30.0–36.0)
MCV: 95.6 fl (ref 78.0–100.0)
MONO ABS: 0.5 10*3/uL (ref 0.1–1.0)
Monocytes Relative: 7.3 % (ref 3.0–12.0)
NEUTROS PCT: 63.3 % (ref 43.0–77.0)
Neutro Abs: 4.4 10*3/uL (ref 1.4–7.7)
PLATELETS: 174 10*3/uL (ref 150.0–400.0)
RBC: 4.72 Mil/uL (ref 4.22–5.81)
RDW: 13.9 % (ref 11.5–15.5)
WBC: 6.9 10*3/uL (ref 4.0–10.5)

## 2016-11-11 LAB — LIPID PANEL
CHOL/HDL RATIO: 2
Cholesterol: 111 mg/dL (ref 0–200)
HDL: 50.5 mg/dL (ref >39.00)
LDL Cholesterol: 46 mg/dL (ref 0–99)
NonHDL: 60.67
TRIGLYCERIDES: 75 mg/dL (ref 0.0–149.0)
VLDL: 15 mg/dL (ref 0.0–40.0)

## 2016-11-11 LAB — TSH: TSH: 7.11 u[IU]/mL — ABNORMAL HIGH (ref 0.35–4.50)

## 2016-11-11 LAB — HEPATIC FUNCTION PANEL
ALBUMIN: 4.2 g/dL (ref 3.5–5.2)
ALK PHOS: 67 U/L (ref 39–117)
ALT: 14 U/L (ref 0–53)
AST: 16 U/L (ref 0–37)
BILIRUBIN DIRECT: 0.2 mg/dL (ref 0.0–0.3)
Total Bilirubin: 0.9 mg/dL (ref 0.2–1.2)
Total Protein: 6.9 g/dL (ref 6.0–8.3)

## 2016-11-11 LAB — BASIC METABOLIC PANEL
BUN: 22 mg/dL (ref 6–23)
CALCIUM: 9.5 mg/dL (ref 8.4–10.5)
CO2: 29 mEq/L (ref 19–32)
CREATININE: 1.38 mg/dL (ref 0.40–1.50)
Chloride: 103 mEq/L (ref 96–112)
GFR: 52.35 mL/min — ABNORMAL LOW (ref >60.00)
GLUCOSE: 89 mg/dL (ref 70–99)
Potassium: 4.1 mEq/L (ref 3.5–5.1)
Sodium: 140 mEq/L (ref 135–145)

## 2016-11-11 NOTE — Patient Instructions (Signed)
It was great seeing you today!  I will follow up with you regarding your blood work and after I hear back form Dr. Stanford Breed.   Please let me know if you need anything.   I will see you for your next exam in one year

## 2016-11-11 NOTE — Progress Notes (Signed)
Subjective:    Patient ID: [name] [name], male    DOB: Apr 02, 1932, 83 y.o.   MRN: [id number]  HPI  Patient presents for yearly follow up exam. He is a pleasant 83 year old male who  has a past medical history of Allergy; BPH (benign prostatic hyperplasia); CAD (coronary artery disease); Cardiomyopathy; GERD (gastroesophageal reflux disease); HYPERLIPIDEMIA; Hypertension; Myocardial infarction [facility]) (2004); RESPIRATORY FAILURE; Skin cancer; Tremor; and UTI.  All immunizations and health maintenance protocols were reviewed with the patient and needed orders were placed. He is up to date on all vaccinations.   Appropriate screening laboratory values were ordered for the patient including screening of hyperlipidemia, renal function and hepatic function.  Medication reconciliation,  past medical history, social history, problem list and allergies were reviewed in detail with the patient  Goals were established with regard to weight loss, exercise, and  diet in compliance with medications  End of life planning was discussed.He has an advanced directive and living will.   He takes lipitor 80 mg daily and 8 1mg  ASA because of history of hyperlipidemia.   He takes Coreg 12.5 mg BID and lisinopril 2.5 mg daily due to history of hypertension and cardiomyopathy. He is followed by Dr. Stanford Breed with Cardiology.   He reports that he stopped taking Primidone 250 mg as " he walked around like a Zombie", unfortunately he did notice a difference in his essential tremor while taking this medication   He gets routine eye and dental care.   He has been seen by Dermatology two months ago  Review of Systems  Constitutional: Negative.   HENT: Negative.   Eyes: Negative.   Respiratory: Negative.   Cardiovascular: Negative.   Gastrointestinal: Negative.   Endocrine: Negative.   Genitourinary: Negative.   Musculoskeletal: Negative.   Allergic/Immunologic: Negative.   Neurological: Positive for tremors  (bilateral upper extremities R>L).  Hematological: Negative.   Psychiatric/Behavioral: Negative.   All other systems reviewed and are negative.  Past Medical History:  Diagnosis Date  . Allergy   . BPH (benign prostatic hyperplasia)   . CAD (coronary artery disease)   . Cardiomyopathy   . GERD (gastroesophageal reflux disease)   . HYPERLIPIDEMIA   . Hypertension   . Myocardial infarction ([place]) 2004  . RESPIRATORY FAILURE   . Skin cancer    unknown if BCC or SCC  . Tremor   . UTI     Social History   Social History  . Marital status: Married    Spouse name: N/A  . Number of children: N/A  . Years of education: N/A   Occupational History  . Not on file.   Social History Main Topics  . Smoking status: Former Smoker    Packs/day: 1.00    Years: 30.00    Types: Cigarettes    Quit date: 07/27/1980  . Smokeless tobacco: Never Used  . Alcohol use No  . Drug use: No  . Sexual activity: No   Other Topics Concern  . Not on file   Social History Narrative   Retired from working in Office manager   Married for 58 years    He likes to walk and read          Past Surgical History:  Procedure Laterality Date  . CARDIAC CATHETERIZATION  04-2003   stents  . CATARACT EXTRACTION Bilateral   . TONSILLECTOMY      Family History  Problem Relation Age of Onset  . Coronary artery disease Father   .  Hypertension Father   . Heart attack Father   . Skin cancer Father     Allergies  Allergen Reactions  . Sulfonamide Derivatives     REACTION: history    Current Outpatient Prescriptions on File Prior to Visit  Medication Sig Dispense Refill  . aspirin 325 MG EC tablet Take 325 mg by mouth daily.      Marland Kitchen atorvastatin (LIPITOR) 80 MG tablet TAKE 1 TABLET BY MOUTH EVERY DAY 100 tablet 0  . carvedilol (COREG) 12.5 MG tablet TAKE 1 TABLET (12.5 MG TOTAL) BY MOUTH 2 (TWO) TIMES DAILY WITH A MEAL. 200 tablet 3  . lisinopril (PRINIVIL,ZESTRIL) 2.5 MG tablet TAKE 1 TABLET  (2.5 MG TOTAL) BY MOUTH 2 (TWO) TIMES DAILY AT 10 AM AND 5 PM. 180 tablet 3  . nitroGLYCERIN (NITROSTAT) 0.4 MG SL tablet Place 1 tablet (0.4 mg total) under the tongue every 5 (five) minutes as needed. 25 tablet 1  . ranitidine (ZANTAC) 150 MG capsule TAKE ONE CAPSULE BY MOUTH EVERY EVENING 100 capsule 3   No current facility-administered medications on file prior to visit.     BP 120/70 (BP Location: Left Arm, Patient Position: Sitting, Cuff Size: Normal)   Temp 97.7 F (36.5 C) (Oral)   Ht 5\' 7"  (1.702 m)   Wt 145 lb 6.4 oz (66 kg)   BMI 22.77 kg/m       Objective:   Physical Exam  Constitutional: He is oriented to person, place, and time. He appears well-developed and well-nourished. No distress.  HENT:  Head: Normocephalic and atraumatic.  Right Ear: External ear normal.  Left Ear: External ear normal.  Nose: Nose normal.  Mouth/Throat: Oropharynx is clear and moist. No oropharyngeal exudate.  Eyes: Conjunctivae and EOM are normal. Pupils are equal, round, and reactive to light. Right eye exhibits no discharge. Left eye exhibits no discharge.  Neck: Trachea normal and normal range of motion. Neck supple. No JVD present. Carotid bruit is not present. No tracheal deviation present. No thyroid mass and no thyromegaly present.  Cardiovascular: Normal rate, regular rhythm, normal heart sounds and intact distal pulses.  Exam reveals no gallop and no friction rub.   No murmur heard. Pulmonary/Chest: Effort normal and breath sounds normal. No stridor. No respiratory distress. He has no wheezes. He has no rales. He exhibits no tenderness.  Abdominal: Soft. Bowel sounds are normal. He exhibits no distension and no mass. There is no tenderness. There is no rebound and no guarding.  Musculoskeletal: Normal range of motion. He exhibits no edema, tenderness or deformity.  Lymphadenopathy:    He has no cervical adenopathy.  Neurological: He is alert and oriented to person, place, and time.  He has normal reflexes. He displays tremor. He displays normal reflexes. No cranial nerve deficit. He exhibits normal muscle tone.  Skin: Skin is warm and dry. No rash noted. He is not diaphoretic. No erythema. No pallor.  Scattered AK's  Psychiatric: He has a normal mood and affect. His behavior is normal. Judgment and thought content normal.  Nursing note and vitals reviewed.      Assessment & Plan:  1. Routine general medical examination at a health care facility - Follow up in one year  - Follow up sooner if needed  - Continue to stay active and eat healthy  - Basic metabolic panel - CBC with Differential/Platelet - Hepatic function panel - Lipid panel - TSH  2. Essential hypertension - Well controlled.  - Basic metabolic panel -  CBC with Differential/Platelet - Hepatic function panel - Lipid panel - TSH  3. Pure hypercholesterolemia - Continue with statin  - Basic metabolic panel - CBC with Differential/Platelet - Hepatic function panel - Lipid panel - TSH  4. Essential tremor - Will speak to Cardiology about trialing Propranolol   Dorothyann Peng, NP

## 2016-11-18 ENCOUNTER — Telehealth: Payer: Self-pay | Admitting: Adult Health

## 2016-11-18 DIAGNOSIS — R251 Tremor, unspecified: Secondary | ICD-10-CM

## 2016-11-18 NOTE — Telephone Encounter (Signed)
I called the pts daughter Vickii Chafe and informed her of the message below.  She stated she would like for him to see a neurologist and she is aware the referral was entered and someone will call with appt info.

## 2016-11-18 NOTE — Telephone Encounter (Signed)
[name] [name] had left a vm to call back....   Labs are stable. His thyroid level is a little elevated. We usually watch this level without treating unless there is signs such as fatigue, constipation, dry skin.   I spoke with Dr. Stanford Breed and he would like to keep you on the blood pressure medication you are currently on. If you would like I could send you to neurology to see if they have any other ideas for the tremor

## 2016-11-18 NOTE — Telephone Encounter (Signed)
Pts daughter Vickii Chafe would like to see if you have heard anything back from the Cardiologist so that you can start him on the medication that you spoke about.  pts daughter would like to have a call back?

## 2016-11-20 ENCOUNTER — Other Ambulatory Visit: Payer: Self-pay | Admitting: Adult Health

## 2016-12-06 ENCOUNTER — Other Ambulatory Visit: Payer: Self-pay | Admitting: Adult Health

## 2016-12-13 ENCOUNTER — Telehealth: Payer: Self-pay | Admitting: Adult Health

## 2016-12-13 NOTE — Telephone Encounter (Signed)
Pt would like to see if you could re-file visit 11/11/16 under their [employer] due to them having this secondary insurance?

## 2017-01-12 ENCOUNTER — Encounter: Payer: Self-pay | Admitting: Neurology

## 2017-01-12 ENCOUNTER — Ambulatory Visit (INDEPENDENT_AMBULATORY_CARE_PROVIDER_SITE_OTHER): Payer: Medicare Other | Admitting: Neurology

## 2017-01-12 VITALS — BP 128/78 | HR 70 | Ht 67.0 in | Wt 146.0 lb

## 2017-01-12 DIAGNOSIS — I2510 Atherosclerotic heart disease of native coronary artery without angina pectoris: Secondary | ICD-10-CM

## 2017-01-12 DIAGNOSIS — G250 Essential tremor: Secondary | ICD-10-CM

## 2017-01-12 MED ORDER — PRIMIDONE 50 MG PO TABS: ORAL_TABLET | ORAL | 5 refills | Status: DC

## 2017-01-12 NOTE — Patient Instructions (Addendum)
  Please remember, that any kind of tremor may be exacerbated by anxiety, anger, nervousness, excitement, dehydration, sleep deprivation, by caffeine, and low blood sugar values or blood sugar fluctuations. Some medications, especially some antidepressants and lithium can cause or exacerbate tremors. Tremors may temporarily calm down or subside with the use of a benzodiazepine such as Valium or related medications and with alcohol. Be aware, however, that drinking alcohol is not an approved or appropriate treatment for tremor control and long-term use of benzodiazepines such as Valium, lorazepam, alprazolam, or clonazepam can cause habit formation, physical and psychological addiction. There are very few medications that symptomatically help with tremor reduction, none are without potential side effects.   Let's try the Mysoline (primidone) 50 mg strength again: Take 1/2 pill each bedtime for 2 weeks, then 1 pill each bedtime for 2 weeks, then 1 1/2 pills each bedtime for 2 weeks, then 2 pills each bedtime thereafter. Common side effects reported are: Sleepiness, drowsiness, balance problems, confusion, and GI related symptoms.  You can try Melatonin at night for sleep: take 1 mg to 3 mg, one to 2 hours before your bedtime. You can go up to 5 mg if needed. It is over the counter and comes in pill form, chewable form and spray, if you prefer.

## 2017-01-12 NOTE — Progress Notes (Signed)
Subjective:    Patient ID: [name] [name] is a 83 y.o. male.  HPI     Star Age, MD, PhD [facility] [address], [address]O. [place], [place] [address]  Dear [name],   I saw your patient, [name] [name], upon your kind request in my neurologic clinic today for initial consultation of his tremors. The patient is accompanied by his wife and daughter today. As you know, [name] [name] is an 83 year old right-handed gentleman with an underlying medical history of coronary artery disease with status post MI, cardiomyopathy, hyperlipidemia, reflux disease, allergies, BPH, and history of skin cancer, who has had a upper extremity tremor for years, at least 5 years. I reviewed your office note from 11/11/2016. He had side effects on Mysoline 250 mg strength, including sleepiness reported, and daughter reports that he had longer reaction time while driving. He was first started on Mysoline 50 mg strength in May 2017 and then about a month later he was put on the 250 mg strength until about October 2017. He believes that the Mysoline had helped at least in the beginning. His wife noted that he slept a little better when he first started the Mysoline but this effect abated after a while. He has a longer standing history of difficulty maintaining sleep secondary to a regular sleep schedule when he was still working. 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 He is currently on carvedilol per cardiology. He had a remote head CT without contrast on 05/19/2003 which I reviewed: IMPRESSION  Mild atrophy and chronic small vessel disease. No acute intracranial pathology evident.   Opacification of the right maxillary sinus and some fluid in the sphenoid sinus.      His tremor is more right sided. He has a family history of tremors in his paternal grandfather, father, 2 nephews (sister's children). He lives with his wife. He has 2 daughters, neither one with tremors at this time. He quit smoking  over 20 years ago and does not drink alcohol on a regular basis, caffeine in the form of coffee one cup per day and a diet soda occasionally. He does not drink water on a regular basis, maybe 1 or 2 cups per day. He has not noticed any problems with his balance, he has not fallen.  His Past Medical History Is Significant For: Past Medical History:  Diagnosis Date  . Allergy   . BPH (benign prostatic hyperplasia)   . CAD (coronary artery disease)   . Cardiomyopathy   . GERD (gastroesophageal reflux disease)   . HYPERLIPIDEMIA   . Hypertension   . Myocardial infarction ([place]) 2004  . RESPIRATORY FAILURE   . Skin cancer    unknown if BCC or SCC  . Tremor   . UTI     His Past Surgical History Is Significant For: Past Surgical History:  Procedure Laterality Date  . CARDIAC CATHETERIZATION  04-2003   stents  . CATARACT EXTRACTION Bilateral   . TONSILLECTOMY      His Family History Is Significant For: Family History  Problem Relation Age of Onset  . Coronary artery disease Father   . Hypertension Father   . Heart attack Father   . Skin cancer Father     His Social History Is Significant For: Social History   Social History  . Marital status: Married    Spouse name: N/A  . Number of children: N/A  . Years of education: N/A   Social History Main Topics  . Smoking  status: Former Smoker    Packs/day: 1.00    Years: 30.00    Types: Cigarettes    Quit date: 07/27/1980  . Smokeless tobacco: Never Used  . Alcohol use No  . Drug use: No  . Sexual activity: No   Other Topics Concern  . None   Social History Narrative   Retired from working in Office manager   Married for 58 years    He likes to walk and read          His Allergies Are:  Allergies  Allergen Reactions  . Sulfonamide Derivatives     REACTION: history  :   His Current Medications Are:  Outpatient Encounter Prescriptions as of 01/12/2017  Medication Sig  . aspirin 325 MG EC tablet Take 325 mg  by mouth daily.    Marland Kitchen atorvastatin (LIPITOR) 80 MG tablet TAKE 1 TABLET BY MOUTH EVERY DAY  . carvedilol (COREG) 12.5 MG tablet TAKE 1 TABLET (12.5 MG TOTAL) BY MOUTH 2 (TWO) TIMES DAILY WITH A MEAL.  Marland Kitchen lisinopril (PRINIVIL,ZESTRIL) 2.5 MG tablet TAKE 1 TABLET TWICE A DAY AT 10 AM AND 5 PM  . nitroGLYCERIN (NITROSTAT) 0.4 MG SL tablet Place 1 tablet (0.4 mg total) under the tongue every 5 (five) minutes as needed.  . ranitidine (ZANTAC) 150 MG capsule TAKE ONE CAPSULE BY MOUTH EVERY EVENING   No facility-administered encounter medications on file as of 01/12/2017.   : Review of Systems:  Out of a complete 14 point review of systems, all are reviewed and negative with the exception of these symptoms as listed below: Review of Systems  Neurological:       Pt presents today to discuss his tremor. Pt tried primidone 250mg  in the past which helped his tremor but made him very sleepy. Pt is right handed and the tremor is mostly in his right hand.    Objective:  Neurological Exam  Physical Exam Physical Examination:   Vitals:   01/12/17 1416  BP: 128/78  Pulse: 70    General Examination: The patient is a very pleasant 83 y.o. male in no acute distress. He appears well-developed and well-nourished and well groomed.   HEENT: Normocephalic, atraumatic, pupils are equal, round and reactive to light and accommodation. Extraocular tracking is good without limitation to gaze excursion or nystagmus noted. Normal smooth pursuit is noted. Hearing is grossly intact. Face is symmetric with normal facial animation and normal facial sensation. Speech is clear with no dysarthria noted. There is no hypophonia. There is no lip, or voice tremor, Has a slight intermittent head tremor. Neck is supple with full range of passive and active motion. There are no carotid bruits on auscultation. Oropharynx exam reveals: moderate mouth dryness, adequate dental hygiene. Tongue protrudes centrally and palate elevates  symmetrically.   Chest: Clear to auscultation without wheezing, rhonchi or crackles noted.  Heart: S1+S2+0, regular and normal without murmurs, rubs or gallops noted, occasional brief (compensatory?) pause.   Abdomen: Soft, non-tender and non-distended with normal bowel sounds appreciated on auscultation.  Extremities: There is no pitting edema in the distal lower extremities bilaterally.   Skin: Warm and dry without trophic changes noted.  Musculoskeletal: exam reveals no obvious joint deformities, tenderness or joint swelling or erythema.   Neurologically:  Mental status: The patient is awake, alert and oriented in all 4 spheres. His immediate and remote memory, attention, language skills and fund of knowledge are appropriate. There is no evidence of aphasia, agnosia, apraxia or anomia. Speech  is clear with normal prosody and enunciation. Thought process is linear. Mood is normal and affect is normal.  Cranial nerves II - XII are as described above under HEENT exam. In addition: shoulder shrug is normal with equal shoulder height noted. Motor exam: Normal bulk, strength and tone is noted. There is no drift, or rebound. Romberg is not tested for safety. Is able to stand narrow based.  On 01/12/2017: There is a mild intermittent resting tremor in the right upper extremity and a very slight intermittent resting tremor in the left upper extremity. He has a bilateral upper extremity postural and action tremor, right more than left. On Archimedes spiral drawing there is moderate tremulousness noted bilaterally. Handwriting is moderately tremulous, not micrographic and legible but difficult to read.   Reflexes are 1+ throughout. Fine motor skills and coordination: Fairly well-preserved in the right and left upper extremities with finger taps, normal hand movements, normal rapid alternating patting, normal foot taps and normal foot agility.  Cerebellar testing: No dysmetria or intention tremor on finger  to nose testing. There is no truncal or gait ataxia.  Sensory exam: intact to light touch proprioception in the upper and lower extremities.  Gait, station and balance: He stands easily. No veering to one side is noted. No leaning to one side is noted. Posture is age-appropriate and stance is narrow based. Gait shows normal stride length and normal pace. No problems turning are noted.       Assessment and Plan:   In summary, Thor BORDEN THUNE is a very pleasant 83 y.o.-year old male with an underlying medical history of coronary artery disease with status post MI, cardiomyopathy, hyperlipidemia, reflux disease, allergies, BPH, and history of skin cancer, who Presents for neurologic consultation of his several year history of tremors affecting both upper extremities, right more than left with a strong family history of tremors. His history and physical exam are in keeping with essential tremor. he had side effects with higher doses of Mysoline. I had a long discussed with the patient and his family regarding tremor and symptomatic treatments. Unfortunately, there are not a whole lot of treatment options available. I did not see any telltale signs of parkinsonism which is reassuring. I suggested we try him on low-dose Mysoline again but we would have to monitor his driving. His family is reminded to keep an eye on his driving skills. We will do a very slow titration starting with Mysoline 50 mg strength half a pill at night for 2 weeks with increase every 2 weeks by half a pill to 200 mg at night. In addition, for treatment of his sleep disturbance at night he can try melatonin. I wrote this down for him for his reference as well.  I would suggest we recheck things in 3 months, sooner as needed. Her briefly also talked to them about DBS surgery for essential tremor but I doubt that he would be considered a good surgical candidate given his history of heart disease including cardiomyopathy.  I asked him to stay  better hydrated with water. We talked about potential triggers for tremors including caffeine consumption and he was discouraged from utilizing alcohol to help reduce the tremor. He does not currently drink alcohol on any regular basis and does not drink caffeine in excess.   I answered all their questions today and the patient and his family were in agreement with the above outlined plan. Thank you very much for allowing me to participate in the care of  this nice patient. If I can be of any further assistance to you please do not hesitate to call me at [phone].  Sincerely,   Star Age, MD, PhD

## 2017-01-27 DIAGNOSIS — H26491 Other secondary cataract, right eye: Secondary | ICD-10-CM | POA: Diagnosis not present

## 2017-01-27 DIAGNOSIS — Z961 Presence of intraocular lens: Secondary | ICD-10-CM | POA: Diagnosis not present

## 2017-01-27 DIAGNOSIS — H52203 Unspecified astigmatism, bilateral: Secondary | ICD-10-CM | POA: Diagnosis not present

## 2017-01-30 ENCOUNTER — Other Ambulatory Visit: Payer: Self-pay | Admitting: Adult Health

## 2017-02-01 NOTE — Telephone Encounter (Signed)
Pts wife is calling stating that they have received a bill stating that the AMWV was pd for but the annual physical with [name] was not pd and they have received their 2nd notice and stating that they are being sent to collection for a $321.00 and they would like to have clarification on this why pt for AMWV and not annual physical?

## 2017-02-02 NOTE — Telephone Encounter (Signed)
Medication filled to pharmacy as requested.   

## 2017-02-02 NOTE — Telephone Encounter (Signed)
Blima Ledger,   I have asked for the Z00.00 to be removed from the [facility] labs. I CC'd you on email.  Thanks,  [employer]

## 2017-02-02 NOTE — Telephone Encounter (Signed)
Per im

## 2017-03-11 ENCOUNTER — Ambulatory Visit (INDEPENDENT_AMBULATORY_CARE_PROVIDER_SITE_OTHER): Payer: Medicare Other

## 2017-03-11 DIAGNOSIS — Z23 Encounter for immunization: Secondary | ICD-10-CM | POA: Diagnosis not present

## 2017-03-14 DIAGNOSIS — L570 Actinic keratosis: Secondary | ICD-10-CM | POA: Diagnosis not present

## 2017-04-14 ENCOUNTER — Ambulatory Visit (INDEPENDENT_AMBULATORY_CARE_PROVIDER_SITE_OTHER): Payer: Medicare Other | Admitting: Neurology

## 2017-04-14 ENCOUNTER — Encounter: Payer: Self-pay | Admitting: Neurology

## 2017-04-14 VITALS — BP 132/75 | HR 76 | Ht 67.0 in | Wt 151.0 lb

## 2017-04-14 DIAGNOSIS — I2510 Atherosclerotic heart disease of native coronary artery without angina pectoris: Secondary | ICD-10-CM | POA: Diagnosis not present

## 2017-04-14 DIAGNOSIS — G250 Essential tremor: Secondary | ICD-10-CM | POA: Diagnosis not present

## 2017-04-14 NOTE — Progress Notes (Signed)
Subjective:    Patient ID: [name] [name] is a 83 y.o. male.  HPI     Interim history:   [name] [name] is an 83 year old right-handed gentleman with an underlying medical history of coronary artery disease with status post MI, cardiomyopathy, hyperlipidemia, reflux disease, allergies, BPH, and history of skin cancer, who presents for follow-up consultation of his essential tremor. The patient is accompanied by his wife and daughter today. I first met him on 01/12/2017 at the request of his primary care provider, at which time the patient reported an approximately five-year history of bilateral upper extremity tremors. He had some side effects on Mysoline, particularly sleepiness. I suggested restarting it on a lower dose. He had some sleep difficulties and I suggested he try melatonin.   Today, 04/14/2017: He reports having noticed no improvement with the Mysoline, he is taking 100 mg each night. His wife reports that he seems to have mostly side effects such as grogginess, sleepiness, balance problems, lack of motivation as he does not tend to walk as much and tends to doze off, and no significant telltale improvement per her perception in the degree of his tremor. He does not drink enough water. He reports that water makes him feel bad and bloated.   Previously:  01/12/2017: (He) has had a upper extremity tremor for years, at least 5 years. I reviewed your office note from 11/11/2016. He had side effects on Mysoline 250 mg strength, including sleepiness reported, and daughter reports that he had longer reaction time while driving. He was first started on Mysoline 50 mg strength in May 2017 and then about a month later he was put on the 250 mg strength until about October 2017. He believes that the Mysoline had helped at least in the beginning. His wife noted that he slept a little better when he first started the Mysoline but this effect abated after a while. He has a longer standing history of  difficulty maintaining sleep secondary to a regular sleep schedule when he was still working. 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 He is currently on carvedilol per cardiology. He had a remote head CT without contrast on 05/19/2003 which I reviewed: IMPRESSION  Mild atrophy and chronic small vessel disease. No acute intracranial pathology evident.   Opacification of the right maxillary sinus and some fluid in the sphenoid sinus.     His tremor is more right sided. He has a family history of tremors in his paternal grandfather, father, 2 nephews (sister's children). He lives with his wife. He has 2 daughters, neither one with tremors at this time. He quit smoking over 20 years ago and does not drink alcohol on a regular basis, caffeine in the form of coffee one cup per day and a diet soda occasionally. He does not drink water on a regular basis, maybe 1 or 2 cups per day. He has not noticed any problems with his balance, he has not fallen.    His Past Medical History Is Significant For: Past Medical History:  Diagnosis Date  . Allergy   . BPH (benign prostatic hyperplasia)   . CAD (coronary artery disease)   . Cardiomyopathy   . GERD (gastroesophageal reflux disease)   . HYPERLIPIDEMIA   . Hypertension   . Myocardial infarction ([place]) 2004  . RESPIRATORY FAILURE   . Skin cancer    unknown if BCC or SCC  . Tremor   . UTI     His Past Surgical History Is Significant For: Past  Surgical History:  Procedure Laterality Date  . CARDIAC CATHETERIZATION  04-2003   stents  . CATARACT EXTRACTION Bilateral   . TONSILLECTOMY      His Family History Is Significant For: Family History  Problem Relation Age of Onset  . Coronary artery disease Father   . Hypertension Father   . Heart attack Father   . Skin cancer Father     His Social History Is Significant For: Social History   Socioeconomic History  . Marital status: Married    Spouse name: None  . Number of children: None  . Years  of education: None  . Highest education level: None  Social Needs  . Financial resource strain: None  . Food insecurity - worry: None  . Food insecurity - inability: None  . Transportation needs - medical: None  . Transportation needs - non-medical: None  Occupational History  . None  Tobacco Use  . Smoking status: Former Smoker    Packs/day: 1.00    Years: 30.00    Pack years: 30.00    Types: Cigarettes    Last attempt to quit: 07/27/1980    Years since quitting: 36.7  . Smokeless tobacco: Never Used  Substance and Sexual Activity  . Alcohol use: No    Alcohol/week: 0.0 oz  . Drug use: No  . Sexual activity: No    Birth control/protection: None  Other Topics Concern  . None  Social History Narrative   Retired from working in Office manager   Married for 58 years    He likes to walk and read       His Allergies Are:  Allergies  Allergen Reactions  . Sulfonamide Derivatives     REACTION: history  :   His Current Medications Are:  Outpatient Encounter Medications as of 04/14/2017  Medication Sig  . aspirin 325 MG EC tablet Take 325 mg by mouth daily.    Marland Kitchen atorvastatin (LIPITOR) 80 MG tablet TAKE 1 TABLET BY MOUTH EVERY DAY  . carvedilol (COREG) 12.5 MG tablet TAKE 1 TABLET (12.5 MG TOTAL) BY MOUTH 2 (TWO) TIMES DAILY WITH A MEAL.  Marland Kitchen lisinopril (PRINIVIL,ZESTRIL) 2.5 MG tablet TAKE 1 TABLET TWICE A DAY AT 10 AM AND 5 PM  . nitroGLYCERIN (NITROSTAT) 0.4 MG SL tablet Place 1 tablet (0.4 mg total) under the tongue every 5 (five) minutes as needed.  . primidone (MYSOLINE) 50 MG tablet 1/2 pill each bedtime x 2 weeks, then 1 pill nightly x 2 weeks, then 1 1/2 pills nightly x 2 weeks, then 2 pills nightly thereafter. (Patient taking differently: Take 100 mg by mouth at bedtime. 1/2 pill each bedtime x 2 weeks, then 1 pill nightly x 2 weeks, then 1 1/2 pills nightly x 2 weeks, then 2 pills nightly thereafter.)  . ranitidine (ZANTAC) 150 MG capsule TAKE ONE CAPSULE BY MOUTH  EVERY EVENING   No facility-administered encounter medications on file as of 04/14/2017.   :  Review of Systems:  Out of a complete 14 point review of systems, all are reviewed and negative with the exception of these symptoms as listed below: Review of Systems  Neurological:       Pt feels that his tremor is getting worse. Pt is taking mysoline 163m qhs.    Objective:  Neurological Exam  Physical Exam Physical Examination:   Vitals:   04/14/17 1405  BP: 132/75  Pulse: 76    General Examination: The patient is a very pleasant 83y.o. male in  no acute distress. He appears well-developed and well-nourished and well groomed.   HEENT: Normocephalic, atraumatic, pupils are equal, round and reactive to light and accommodation. Extraocular tracking is good without limitation to gaze excursion or nystagmus noted. Normal smooth pursuit is noted. Hearing is grossly intact. Face is symmetric with normal facial animation and normal facial sensation. Speech is clear with no dysarthria noted. There is no hypophonia. There is no lip, or voice tremor, Has a slight intermittent head tremor. Neck is supple with full range of passive and active motion. There are no carotid bruits on auscultation. Oropharynx exam reveals: moderate mouth dryness, adequate dental hygiene. Tongue protrudes centrally and palate elevates symmetrically.   Chest: Clear to auscultation without wheezing, rhonchi or crackles noted.  Heart: S1+S2+0, regular and normal without murmurs, rubs or gallops noted, occasional brief (compensatory?) pause.   Abdomen: Soft, non-tender and non-distended with normal bowel sounds appreciated on auscultation.  Extremities: There is no pitting edema in the distal lower extremities bilaterally.   Skin: Warm and dry without trophic changes noted.  Musculoskeletal: exam reveals no obvious joint deformities, tenderness or joint swelling or erythema.   Neurologically:  Mental status:  The patient is awake, alert and oriented in all 4 spheres. His immediate and remote memory, attention, language skills and fund of knowledge are appropriate. There is no evidence of aphasia, agnosia, apraxia or anomia. Speech is clear with normal prosody and enunciation. Thought process is linear. Mood is normal and affect is normal.  Cranial nerves II - XII are as described above under HEENT exam. In addition: shoulder shrug is normal with equal shoulder height noted. Motor exam: Normal bulk, strength and tone is noted. There is no drift, or rebound. Romberg is not tested for safety. Is able to stand narrow based.   (On 01/12/2017: On Archimedes spiral drawing there is moderate tremulousness noted bilaterally. Handwriting is moderately tremulous, not micrographic and legible but difficult to read.)  There is a mild intermittent resting tremor in the right upper extremity and a very slight intermittent resting tremor in the left upper extremity. He has a bilateral upper extremity postural and action tremor, right more than left.   Reflexes are 1+ throughout. Fine motor skills and coordination: mildly impaired globally.   Cerebellar testing: No dysmetria or intention tremor on finger to nose testing. There is no truncal or gait ataxia.  Sensory exam: intact to light touch proprioception in the upper and lower extremities.  Gait, station and balance: He stands with mild difficulty. His posture is fairly age-appropriate. He walks somewhat cautiously. No problems turning.   Assessment and Plan:   In summary, [name] [name] is a very pleasant 83 year old male with an underlying medical history of coronary artery disease with status post MI, cardiomyopathy, hyperlipidemia, reflux disease, allergies, BPH, and history of skin cancer, who presents for follow up consultation of his essential tremor of many years duration. He has a family history of tremors as well. He had side effects with higher dose of  Mysoline and we retried it at a lower dose, he is currently taking 100 mg at night and still has side effects including drowsiness, balance problems, and per wife also some personality changes. I again discussed with the patient and his family his symptoms and potential symptomatic treatment options. Unfortunately, there is not a whole lot we can do at this time from my end of things. We can certainly request a second opinion with a movement disorder specialist either locally here  in [place] or with an academic center such as [facility] or [facility] or [facility]. He is not quite ready for another referral. He is encouraged to think about it and discuss it with his family. I also talked to the patient and his family about the treatment option of DBS. They are reluctant to consider this but I suggested we can think about a consultation. We mutually agreed to taper him off of Mysoline at this time. He will reduce it to 1 pill each night for 1 week then half a pill each night for 1 week then stop. I will see him back routinely in about 6 months. He is encouraged to stay well-hydrated. I answered all their questions today and the patient and his family were in agreement.  I spent 25 minutes in total face-to-face time with the patient, more than 50% of which was spent in counseling and coordination of care, reviewing test results, reviewing medication and discussing or reviewing the diagnosis of ET, its prognosis and treatment options. Pertinent laboratory and imaging test results that were available during this visit with the patient were reviewed by me and considered in my medical decision making (see chart for details).

## 2017-04-14 NOTE — Patient Instructions (Addendum)
Please taper off the mysoline by taking 1 pill each night for 1 week, then 1/2 pill each night for one week, then stop altogether.   We can request a second opinion with another movement disorder specialist, either with another specialist in town, or at [facility], [place] or [facility].   We can do a follow up routinely in 6 months. Please stay well hydrated.   As discussed, I can also make a referral for you to be evaluated for deep brain stimulation (DBS) for treatment of advanced essential tremor.

## 2017-05-06 ENCOUNTER — Other Ambulatory Visit: Payer: Self-pay | Admitting: Adult Health

## 2017-05-06 NOTE — Telephone Encounter (Signed)
Sent to the pharmacy by e-scribe. 

## 2017-06-02 ENCOUNTER — Emergency Department (HOSPITAL_COMMUNITY): Payer: Medicare Other

## 2017-06-02 ENCOUNTER — Observation Stay (HOSPITAL_COMMUNITY)
Admission: EM | Admit: 2017-06-02 | Discharge: 2017-06-03 | Disposition: A | Discharge status: Home / Self Care | Payer: Medicare Other | Attending: Family Medicine | Admitting: Family Medicine

## 2017-06-02 ENCOUNTER — Other Ambulatory Visit: Payer: Self-pay

## 2017-06-02 ENCOUNTER — Encounter (HOSPITAL_COMMUNITY): Payer: Self-pay | Admitting: Radiology

## 2017-06-02 ENCOUNTER — Observation Stay (HOSPITAL_COMMUNITY): Payer: Medicare Other

## 2017-06-02 DIAGNOSIS — I129 Hypertensive chronic kidney disease with stage 1 through stage 4 chronic kidney disease, or unspecified chronic kidney disease: Secondary | ICD-10-CM | POA: Diagnosis not present

## 2017-06-02 DIAGNOSIS — R4701 Aphasia: Secondary | ICD-10-CM | POA: Diagnosis not present

## 2017-06-02 DIAGNOSIS — I252 Old myocardial infarction: Secondary | ICD-10-CM | POA: Insufficient documentation

## 2017-06-02 DIAGNOSIS — Z87891 Personal history of nicotine dependence: Secondary | ICD-10-CM | POA: Diagnosis not present

## 2017-06-02 DIAGNOSIS — L989 Disorder of the skin and subcutaneous tissue, unspecified: Secondary | ICD-10-CM

## 2017-06-02 DIAGNOSIS — N183 Chronic kidney disease, stage 3 unspecified: Secondary | ICD-10-CM | POA: Diagnosis present

## 2017-06-02 DIAGNOSIS — N400 Enlarged prostate without lower urinary tract symptoms: Secondary | ICD-10-CM | POA: Diagnosis present

## 2017-06-02 DIAGNOSIS — Z79899 Other long term (current) drug therapy: Secondary | ICD-10-CM | POA: Insufficient documentation

## 2017-06-02 DIAGNOSIS — R251 Tremor, unspecified: Secondary | ICD-10-CM | POA: Diagnosis not present

## 2017-06-02 DIAGNOSIS — Z7982 Long term (current) use of aspirin: Secondary | ICD-10-CM | POA: Diagnosis not present

## 2017-06-02 DIAGNOSIS — I2510 Atherosclerotic heart disease of native coronary artery without angina pectoris: Secondary | ICD-10-CM | POA: Diagnosis not present

## 2017-06-02 DIAGNOSIS — I429 Cardiomyopathy, unspecified: Secondary | ICD-10-CM | POA: Diagnosis present

## 2017-06-02 DIAGNOSIS — Z85828 Personal history of other malignant neoplasm of skin: Secondary | ICD-10-CM | POA: Diagnosis not present

## 2017-06-02 DIAGNOSIS — I10 Essential (primary) hypertension: Secondary | ICD-10-CM | POA: Diagnosis not present

## 2017-06-02 DIAGNOSIS — G459 Transient cerebral ischemic attack, unspecified: Secondary | ICD-10-CM | POA: Diagnosis not present

## 2017-06-02 DIAGNOSIS — I6789 Other cerebrovascular disease: Secondary | ICD-10-CM | POA: Diagnosis not present

## 2017-06-02 DIAGNOSIS — I6523 Occlusion and stenosis of bilateral carotid arteries: Secondary | ICD-10-CM | POA: Diagnosis not present

## 2017-06-02 DIAGNOSIS — G250 Essential tremor: Secondary | ICD-10-CM | POA: Diagnosis present

## 2017-06-02 DIAGNOSIS — R4781 Slurred speech: Secondary | ICD-10-CM | POA: Diagnosis not present

## 2017-06-02 DIAGNOSIS — R4182 Altered mental status, unspecified: Secondary | ICD-10-CM | POA: Diagnosis present

## 2017-06-02 LAB — I-STAT CHEM 8, ED
BUN: 33 mg/dL — ABNORMAL HIGH (ref 6–20)
CALCIUM ION: 1.02 mmol/L — AB (ref 1.15–1.40)
CHLORIDE: 106 mmol/L (ref 101–111)
Creatinine, Ser: 1.2 mg/dL (ref 0.61–1.24)
GLUCOSE: 134 mg/dL — AB (ref 65–99)
HCT: 45 % (ref 39.0–52.0)
HEMOGLOBIN: 15.3 g/dL (ref 13.0–17.0)
Potassium: 4.8 mmol/L (ref 3.5–5.1)
SODIUM: 139 mmol/L (ref 135–145)
TCO2: 24 mmol/L (ref 22–32)

## 2017-06-02 LAB — DIFFERENTIAL
BASOS PCT: 0 %
Basophils Absolute: 0 10*3/uL (ref 0.0–0.1)
EOS ABS: 0.2 10*3/uL (ref 0.0–0.7)
EOS PCT: 3 %
Lymphocytes Relative: 24 %
Lymphs Abs: 1.3 10*3/uL (ref 0.7–4.0)
MONO ABS: 0.4 10*3/uL (ref 0.1–1.0)
MONOS PCT: 7 %
NEUTROS ABS: 3.7 10*3/uL (ref 1.7–7.7)
Neutrophils Relative %: 66 %

## 2017-06-02 LAB — CBC
HEMATOCRIT: 47.5 % (ref 39.0–52.0)
Hemoglobin: 15.1 g/dL (ref 13.0–17.0)
MCH: 30.9 pg (ref 26.0–34.0)
MCHC: 31.8 g/dL (ref 30.0–36.0)
MCV: 97.1 fL (ref 78.0–100.0)
PLATELETS: 185 10*3/uL (ref 150–400)
RBC: 4.89 MIL/uL (ref 4.22–5.81)
RDW: 14.2 % (ref 11.5–15.5)
WBC: 5.6 10*3/uL (ref 4.0–10.5)

## 2017-06-02 LAB — URINALYSIS, ROUTINE W REFLEX MICROSCOPIC
BILIRUBIN URINE: NEGATIVE
Glucose, UA: NEGATIVE mg/dL
HGB URINE DIPSTICK: NEGATIVE
Ketones, ur: NEGATIVE mg/dL
Leukocytes, UA: NEGATIVE
Nitrite: NEGATIVE
PH: 8 (ref 5.0–8.0)
Protein, ur: NEGATIVE mg/dL
SPECIFIC GRAVITY, URINE: 1.028 (ref 1.005–1.030)

## 2017-06-02 LAB — COMPREHENSIVE METABOLIC PANEL
ALT: 19 U/L (ref 17–63)
ANION GAP: 9 (ref 5–15)
AST: 26 U/L (ref 15–41)
Albumin: 3.4 g/dL — ABNORMAL LOW (ref 3.5–5.0)
Alkaline Phosphatase: 87 U/L (ref 38–126)
BUN: 23 mg/dL — ABNORMAL HIGH (ref 6–20)
CHLORIDE: 105 mmol/L (ref 101–111)
CO2: 25 mmol/L (ref 22–32)
CREATININE: 1.32 mg/dL — AB (ref 0.61–1.24)
Calcium: 8.7 mg/dL — ABNORMAL LOW (ref 8.9–10.3)
GFR, EST AFRICAN AMERICAN: 56 mL/min — AB (ref >60)
GFR, EST NON AFRICAN AMERICAN: 48 mL/min — AB (ref >60)
Glucose, Bld: 139 mg/dL — ABNORMAL HIGH (ref 65–99)
POTASSIUM: 4.2 mmol/L (ref 3.5–5.1)
Sodium: 139 mmol/L (ref 135–145)
Total Bilirubin: 1.2 mg/dL (ref 0.3–1.2)
Total Protein: 6.5 g/dL (ref 6.5–8.1)

## 2017-06-02 LAB — RAPID URINE DRUG SCREEN, HOSP PERFORMED
AMPHETAMINES: NOT DETECTED
BENZODIAZEPINES: NOT DETECTED
Barbiturates: NOT DETECTED
Cocaine: NOT DETECTED
OPIATES: NOT DETECTED
Tetrahydrocannabinol: NOT DETECTED

## 2017-06-02 LAB — I-STAT TROPONIN, ED: TROPONIN I, POC: 0.01 ng/mL (ref 0.00–0.08)

## 2017-06-02 LAB — PROTIME-INR
INR: 1.16
PROTHROMBIN TIME: 14.7 s (ref 11.4–15.2)

## 2017-06-02 LAB — APTT: aPTT: 28 seconds (ref 24–36)

## 2017-06-02 LAB — ETHANOL

## 2017-06-02 MED ORDER — IOPAMIDOL (ISOVUE-370) INJECTION 76%
INTRAVENOUS | Status: AC
  Administered 2017-06-02: 50 mL
  Filled 2017-06-02: qty 50

## 2017-06-02 MED ORDER — FAMOTIDINE 20 MG PO TABS
20.0000 mg | ORAL_TABLET | Freq: Every day | ORAL | Status: DC
  Administered 2017-06-03: 20 mg via ORAL
  Filled 2017-06-02: qty 1

## 2017-06-02 MED ORDER — SODIUM CHLORIDE 0.9 % IV BOLUS (SEPSIS)
500.0000 mL | Freq: Once | INTRAVENOUS | Status: AC
  Administered 2017-06-02: 500 mL via INTRAVENOUS

## 2017-06-02 MED ORDER — ACETAMINOPHEN 325 MG PO TABS: 650.0000 mg | ORAL_TABLET | ORAL | Status: DC | PRN

## 2017-06-02 MED ORDER — CARVEDILOL 12.5 MG PO TABS
12.5000 mg | ORAL_TABLET | Freq: Two times a day (BID) | ORAL | Status: DC
  Administered 2017-06-02 – 2017-06-03 (×3): 12.5 mg via ORAL
  Filled 2017-06-02 (×3): qty 1

## 2017-06-02 MED ORDER — ACETAMINOPHEN 650 MG RE SUPP: 650.0000 mg | RECTAL | Status: DC | PRN

## 2017-06-02 MED ORDER — LISINOPRIL 2.5 MG PO TABS
2.5000 mg | ORAL_TABLET | Freq: Two times a day (BID) | ORAL | Status: DC
  Administered 2017-06-02 – 2017-06-03 (×3): 2.5 mg via ORAL
  Filled 2017-06-02 (×3): qty 1

## 2017-06-02 MED ORDER — ATORVASTATIN CALCIUM 80 MG PO TABS
80.0000 mg | ORAL_TABLET | Freq: Every day | ORAL | Status: DC
  Administered 2017-06-02 – 2017-06-03 (×2): 80 mg via ORAL
  Filled 2017-06-02 (×2): qty 1

## 2017-06-02 MED ORDER — ASPIRIN 325 MG PO TABS
325.0000 mg | ORAL_TABLET | Freq: Every day | ORAL | Status: DC
  Administered 2017-06-02 – 2017-06-03 (×2): 325 mg via ORAL
  Filled 2017-06-02 (×2): qty 1

## 2017-06-02 MED ORDER — STROKE: EARLY STAGES OF RECOVERY BOOK
Freq: Once | Status: AC
  Administered 2017-06-03: 06:00:00
  Filled 2017-06-02: qty 1

## 2017-06-02 MED ORDER — ASPIRIN 300 MG RE SUPP: 300.0000 mg | Freq: Every day | RECTAL | Status: DC

## 2017-06-02 MED ORDER — SENNOSIDES-DOCUSATE SODIUM 8.6-50 MG PO TABS: 1.0000 | ORAL_TABLET | Freq: Every evening | ORAL | Status: DC | PRN

## 2017-06-02 MED ORDER — RISAQUAD PO CAPS
1.0000 | ORAL_CAPSULE | Freq: Every day | ORAL | Status: DC
  Administered 2017-06-02 – 2017-06-03 (×2): 1 via ORAL
  Filled 2017-06-02 (×2): qty 1

## 2017-06-02 MED ORDER — ENOXAPARIN SODIUM 40 MG/0.4ML ~~LOC~~ SOLN
40.0000 mg | SUBCUTANEOUS | Status: DC
  Administered 2017-06-02: 40 mg via SUBCUTANEOUS
  Filled 2017-06-02: qty 0.4

## 2017-06-02 MED ORDER — ACETAMINOPHEN 160 MG/5ML PO SOLN: 650.0000 mg | ORAL | Status: DC | PRN

## 2017-06-02 NOTE — Consult Note (Signed)
Requesting Physician: Dr. Darl Householder    Chief Complaint: Altered mental status and not talking for approximately 10 minutes  History obtained from: Wife  HPI:                                                                                                                                         [name] [name] is an 84 y.o. male with past medical history of hyperlipidemia, hypertension and CAD.  Patient apparently was eating breakfast this morning with his wife around 68 when he suddenly stopped talking, was not moving, did not respond to her thus EMS was called.  At approximately 10 minutes later when EMS arrived patient was back to his baseline and knew where he was and able to talk to EMS.  On arrival to the emergency department patient was back to his baseline.  Patient has never had a seizure in the past nor stroke per wife.  Code stroke was go to be called however neurology entered the room and noted there was a NIH of 0 thus it was not called.  Date last known well: Date: 06/02/2017 Time last known well: Time: 10:30 tPA Given: No: Symptoms resolved NIH stroke scale of 0 Modified Rankin: Rankin Score=0    Past Medical History:  Diagnosis Date  . Allergy   . BPH (benign prostatic hyperplasia)   . CAD (coronary artery disease)   . Cardiomyopathy   . GERD (gastroesophageal reflux disease)   . HYPERLIPIDEMIA   . Hypertension   . Myocardial infarction ([place]) 2004  . RESPIRATORY FAILURE   . Skin cancer    unknown if BCC or SCC  . Tremor   . UTI     Past Surgical History:  Procedure Laterality Date  . CARDIAC CATHETERIZATION  04-2003   stents  . CATARACT EXTRACTION Bilateral   . TONSILLECTOMY      Family History  Problem Relation Age of Onset  . Coronary artery disease Father   . Hypertension Father   . Heart attack Father   . Skin cancer Father    Social History:  reports that he quit smoking about 36 years ago. His smoking use included cigarettes. He has a 30.00 pack-year  smoking history. he has never used smokeless tobacco. He reports that he does not drink alcohol or use drugs.  Allergies:  Allergies  Allergen Reactions  . Sulfonamide Derivatives     REACTION: history    Medications:  Current Facility-Administered Medications  Medication Dose Route Frequency Provider Last Rate Last Dose  . sodium chloride 0.9 % bolus 500 mL  500 mL Intravenous Once Drenda Freeze, MD       Current Outpatient Medications  Medication Sig Dispense Refill  . aspirin 325 MG EC tablet Take 325 mg by mouth daily.      Marland Kitchen atorvastatin (LIPITOR) 80 MG tablet TAKE 1 TABLET BY MOUTH EVERY DAY 90 tablet 1  . carvedilol (COREG) 12.5 MG tablet TAKE 1 TABLET (12.5 MG TOTAL) BY MOUTH 2 (TWO) TIMES DAILY WITH A MEAL. 200 tablet 2  . lisinopril (PRINIVIL,ZESTRIL) 2.5 MG tablet TAKE 1 TABLET TWICE A DAY AT 10 AM AND 5 PM 180 tablet 3  . nitroGLYCERIN (NITROSTAT) 0.4 MG SL tablet Place 1 tablet (0.4 mg total) under the tongue every 5 (five) minutes as needed. 25 tablet 1  . primidone (MYSOLINE) 50 MG tablet 1/2 pill each bedtime x 2 weeks, then 1 pill nightly x 2 weeks, then 1 1/2 pills nightly x 2 weeks, then 2 pills nightly thereafter. (Patient taking differently: Take 100 mg by mouth at bedtime. 1/2 pill each bedtime x 2 weeks, then 1 pill nightly x 2 weeks, then 1 1/2 pills nightly x 2 weeks, then 2 pills nightly thereafter.) 60 tablet 5  . ranitidine (ZANTAC) 150 MG capsule TAKE ONE CAPSULE BY MOUTH EVERY EVENING 100 capsule 3     ROS:                                                                                                                                       History obtained from Wife  General ROS: negative for - chills, fatigue, fever, night sweats, weight gain or weight loss Psychological ROS: negative for - , hallucinations, memory difficulties,  mood swings or  Ophthalmic ROS: negative for - blurry vision, double vision, eye pain or loss of vision ENT ROS: negative for - epistaxis, nasal discharge, oral lesions, sore throat, tinnitus or vertigo Respiratory ROS: negative for - cough,  shortness of breath or wheezing Cardiovascular ROS: negative for - chest pain, dyspnea on exertion,  Gastrointestinal ROS: negative for - abdominal pain, diarrhea,  nausea/vomiting or stool incontinence Genito-Urinary ROS: negative for - dysuria, hematuria, incontinence or urinary frequency/urgency Musculoskeletal ROS: negative for - joint swelling or muscular weakness Neurological ROS: as noted in HPI   General Examination:  Blood pressure (!) 138/97, pulse 77, temperature 97.7 F (36.5 C), resp. rate 19, height 5\' 7"  (1.702 m), weight 68 kg (150 lb), SpO2 99 %.  HEENT-  Normocephalic, no lesions, without obvious abnormality.  Normal external eye and conjunctiva.   Cardiovascular- S1-S2 audible, pulses palpable throughout   Lungs-no rhonchi or wheezing noted, no excessive working breathing.  Saturations within normal limits Abdomen- All 4 quadrants palpated and nontender Extremities- Warm, dry and intact Musculoskeletal-no joint tenderness, deformity or swelling Skin-warm and dry, no hyperpigmentation, vitiligo, or suspicious lesions  Neurological Examination Mental Status: Alert, oriented, thought content appropriate.  Speech fluent without evidence of aphasia.  Able to follow 3 step commands without difficulty. Cranial Nerves: IO:XBDZHG fields grossly normal,  III,IV, VI: ptosis not present, extra-ocular motions intact bilaterally, pupils equal, round, reactive to light and accommodation V,VII: smile symmetric, facial light touch sensation normal bilaterally VIII: hearing normal bilaterally IX,X: uvula rises symmetrically XI: bilateral shoulder  shrug XII: midline tongue extension Motor: Right : Upper extremity   5/5    Left:     Upper extremity   5/5  Lower extremity   5/5     Lower extremity   5/5 Postural and resting tremor in bilateral upper extremities especially at the wrists and hands Sensory: Pinprick and light touch intact throughout, bilaterally Deep Tendon Reflexes: 2+ and symmetric throughout Plantars: Right: downgoing   Left: downgoing Cerebellar: normal finger-to-nose, Gait: Not tested       Lab Results: Basic Metabolic Panel: Recent Labs  Lab 06/02/17 1206  NA 139  K 4.8  CL 106  GLUCOSE 134*  BUN 33*  CREATININE 1.20    CBC: Recent Labs  Lab 06/02/17 1152 06/02/17 1206  WBC 5.6  --   NEUTROABS 3.7  --   HGB 15.1 15.3  HCT 47.5 45.0  MCV 97.1  --   PLT 185  --    Imaging: No results found.  Assessment and plan discussed with with attending physician and they are in agreement.    Etta Quill PA-C [facility] [phone]  06/02/2017, 12:02 PM   Assessment: 84 y.o. male with transient episode of unresponsiveness that lasted for approximately 10 minutes.  There is no jerking noted however patient was nonresponsive to his wife when she was talking.  At this time differential diagnosis includes TIA/stroke/seizure.  At this time it would benefit the patient to have a TIA/stroke workup given his stroke risk factors.  In addition we will order EEG as patient does not recall event and this very well could have been a seizure.  Stroke Risk Factors - hyperlipidemia and hypertension  Recommend --HgbA1c, fasting lipid panel --MRI/MRA of the brain without contrast --PT consult, OT consult, Speech consult --Echocardiogram --80 mg of Atorvistatin --Prophylactic therapy-Antiplatelet med: Aspirin 325 --Risk factor modification --Telemetry monitoring --EEG-- and seizure precautions --Frequent neuro checks --NPO until passes stroke swallow screen  please page stroke NP  Or  PA  Or  MD from 8am -4 pm  as this patient from this time will be  followed by the stroke.   You can look them up on [email or web address]  [place]  Attending Neurohospitalist Addendum Patient seen and examined with APP/Resident. Agree with the history and physical as documented above. Agree with the plan as documented, which I helped formulate and have edited the note above I have independently reviewed the chart, obtained history, review of systems and examined the patient.I have personally reviewed pertinent head/neck/spine imaging (CT/MRI). Please feel free to  call with any questions. --- Amie Portland, MD [facility] Pager: [phone]  If 7pm to 7am, please call on call as listed on AMION.

## 2017-06-02 NOTE — Progress Notes (Signed)
EEG complete - results pending 

## 2017-06-02 NOTE — Procedures (Signed)
EEG Report  Clinical History: Sudden alteration of speech and awareness lasting 10 minutes.  Technical Summary:  A 19 channel digital EEG recording was performed using the 10-20 international system of electrode placement.  Bipolar and Referential montages were used.  The total recording time was approx 20 minutes.  Findings:  There is a posterior dominant rhythm of 8 Hz reactive to eye opening and closure.  No focal slowing is present.  Photic stimulation produced a symmetric driving response and did not elicit any abnormalities.  Hyperventilation was not performed.   Sleep is not recorded. There are no epileptiform discharges or electrographic seizures present.     Impression:  This is a normal EEG in the awake state.  There is no evidence of a seizure tendency on this recording, however, this does not rule out underlying epilepsy.  If seizures remain a clinical concern, then a sleep deprived EEG and/or more prolonged EEG may be of additional value.  Rogue Jury, MS,  MD

## 2017-06-02 NOTE — ED Notes (Addendum)
MD Rory Percy at bedside w/ MD Darl Householder. NO code stroke at this time per MD Rory Percy

## 2017-06-02 NOTE — ED Triage Notes (Signed)
Pt arrives via EMS for eval of episode of aphasia this AM around 1030. Pt's wife reports that he was eating breakfast, had approx 3 minute episode of "not being able to speak" which prompted wife to call EMS. Pt asymptomatic w/  No aphasia on arrival. A&Ox4, baseline tremors present

## 2017-06-02 NOTE — H&P (Signed)
History and Physical    [name] [name] PRF:[id number] DOB: 10-Dec-1931 DOA: 06/02/2017  **Will admit patient based on the expectation that the patient will need hospitalization/ hospital care that crosses at least 2 midnights  PCP: Dorothyann Peng, NP   Attending physician: Evangeline Gula  Patient coming from/Resides with: Private residence/wife  Chief Complaint: Aphasia with altered mentation  HPI: [name] [name] is a 84 y.o. male with medical history significant for hypertension, myocardial infarction December 2004 with V. fib arrest-subsequent PCI to LAD, ischemic cardiomyopathy with EF 30% based on Myoview thousand 13, new diagnosis of benign essential tremor with patient demonstrating intolerance to Mysoline, dyslipidemia and BPH.  Patient has also undergone multiple excisions of superior scalp actinic keratosis by outpatient dermatology.  Patient was brought to the ER by EMS after developing aphasia and decreased responsiveness for several minutes while eating breakfast.  By the time EMS arrived to the home patient was back to baseline, was oriented and able to talk.  His NIH score was 0 so a code stroke was not initiated.  Initial CT of the head was negative for acute intracranial abnormalities.  Neurology has been consulted and recommended observation evaluation for TIA vs atypical CVA vs r/o seizure.  ED Course:  Vital Signs: BP 135/81   Pulse 60   Temp 97.8 F (36.6 C)   Resp 19   Ht 5\' 7"  (1.702 m)   Wt 68 kg (150 lb)   SpO2 96%   BMI 23.49 kg/m  CT head: As above CTA head/neck: No significant large vessel abnormalities, mild diffuse distal MCA branch vessel disease Lab data: Sodium 139, potassium 4.2, chloride 105, CO2 25, glucose 139, BUN 23, creatinine 1.32, calcium 8.7, anion gap 9, albumin 3.4, LFTs not elevated, troponin poc 0.01, white count 5600 with normal differential, hemoglobin 15.1, platelets 185,000, coags normal Medications and treatments: Normal saline bolus x  500 cc  Review of Systems:  In addition to the HPI above,  No Fever-chills, myalgias or other constitutional symptoms No Headache, changes with Vision or hearing, new weakness, tingling, numbness in any extremity, dizziness, gait disturbance or imbalance, no change in chronic tremors, no obvious tonic-clonic type seizure activity No problems swallowing food or Liquids, indigestion/reflux, choking or coughing while eating, abdominal pain with or after eating No Chest pain, Cough or Shortness of Breath, palpitations, orthopnea or DOE No Abdominal pain, N/V, melena,hematochezia, dark tarry stools, constipation No dysuria, malodorous urine, hematuria or flank pain No new skin rashes, lesions, masses or bruises, No new joint pains, aches, swelling or redness No recent unintentional weight gain or loss No polyuria, polydypsia or polyphagia   Past Medical History:  Diagnosis Date  . Allergy   . BPH (benign prostatic hyperplasia)   . CAD (coronary artery disease)   . Cardiomyopathy   . GERD (gastroesophageal reflux disease)   . HYPERLIPIDEMIA   . Hypertension   . Myocardial infarction ([place]) 2004  . RESPIRATORY FAILURE   . Skin cancer    unknown if BCC or SCC  . Tremor   . UTI     Past Surgical History:  Procedure Laterality Date  . CARDIAC CATHETERIZATION  04-2003   stents  . CATARACT EXTRACTION Bilateral   . TONSILLECTOMY      Social History   Socioeconomic History  . Marital status: Married    Spouse name: Not on file  . Number of children: Not on file  . Years of education: Not on file  . Highest education level:  Not on file  Social Needs  . Financial resource strain: Not on file  . Food insecurity - worry: Not on file  . Food insecurity - inability: Not on file  . Transportation needs - medical: Not on file  . Transportation needs - non-medical: Not on file  Occupational History  . Not on file  Tobacco Use  . Smoking status: Former Smoker    Packs/day: 1.00     Years: 30.00    Pack years: 30.00    Types: Cigarettes    Last attempt to quit: 07/27/1980    Years since quitting: 36.8  . Smokeless tobacco: Never Used  Substance and Sexual Activity  . Alcohol use: No    Alcohol/week: 0.0 oz  . Drug use: No  . Sexual activity: No    Birth control/protection: None  Other Topics Concern  . Not on file  Social History Narrative   Retired from working in Office manager   Married for 58 years    He likes to walk and read       Mobility: Independent Work history: Not obtained   Allergies  Allergen Reactions  . Sulfonamide Derivatives     REACTION: history    Family History  Problem Relation Age of Onset  . Coronary artery disease Father   . Hypertension Father   . Heart attack Father   . Skin cancer Father     Prior to Admission medications   Medication Sig Start Date End Date Taking? Authorizing Provider  acidophilus (RISAQUAD) CAPS capsule Take 1 capsule by mouth daily.   Yes [provider]  Alpha-D-Galactosidase Satira Mccallum) TABS Take 3 tablets by mouth 2 (two) times daily as needed (gas).   Yes [provider]  aspirin 325 MG EC tablet Take 325 mg by mouth daily.     Yes [provider]  atorvastatin (LIPITOR) 80 MG tablet TAKE 1 TABLET BY MOUTH EVERY DAY 05/06/17  Yes Nafziger, [name], NP  carvedilol (COREG) 12.5 MG tablet TAKE 1 TABLET (12.5 MG TOTAL) BY MOUTH 2 (TWO) TIMES DAILY WITH A MEAL. 11/23/16  Yes Nafziger, [name], NP  lisinopril (PRINIVIL,ZESTRIL) 2.5 MG tablet TAKE 1 TABLET TWICE A DAY AT 10 AM AND 5 PM 12/07/16  Yes Nafziger, [name], NP  ranitidine (ZANTAC) 150 MG capsule TAKE ONE CAPSULE BY MOUTH EVERY EVENING 11/15/14  Yes Dorena Cookey, MD  nitroGLYCERIN (NITROSTAT) 0.4 MG SL tablet Place 1 tablet (0.4 mg total) under the tongue every 5 (five) minutes as needed. 08/29/13   Dorena Cookey, MD    Physical Exam: Vitals:   06/02/17 1515 06/02/17 1530 06/02/17 1545 06/02/17 1600  BP: 122/73 127/78  129/77 135/81  Pulse: 64 86 64 60  Resp: 16 11 19 19   Temp:      SpO2: 97% 95% 96% 96%  Weight:      Height:          Constitutional: NAD, calm, comfortable Eyes: PERRL, lids and conjunctivae normal-bilateral scleral injection ENMT: Mucous membranes are moist. Posterior pharynx clear of any exudate or lesions..  Neck: normal, supple, no masses, no thyromegaly Respiratory: clear to auscultation bilaterally, no wheezing, no crackles. Normal respiratory effort. No accessory muscle use.  Cardiovascular: Regular rate and rhythm, no murmurs / rubs / gallops. No extremity edema. 2+ pedal pulses. No carotid bruits.  Abdomen: no tenderness, no masses palpated. No hepatosplenomegaly. Bowel sounds positive.  Musculoskeletal: no clubbing / cyanosis. No joint deformity upper and lower extremities. Good ROM, no contractures.  Normal muscle tone.  Skin: no rashes, lesions, ulcers. No induration.  Patient has several areas of skin excoriation with associated redness as well as clustering of vesicular lesions that appear to have clear fluid most notable on the anterior scalp/forehead-patient describes as pruritic-he also has typical areas of actinic keratosis on the superior aspect of the scalp. Neurologic: CN 2-12 grossly intact. Sensation intact, DTR normal. Strength 5/5 x all 4 extremities.  Noted with chronic bilateral upper extremity tremors-more flapping of hands especially with intention in context of known benign essential tremor disorder Psychiatric: Normal judgment and insight. Alert and oriented x 3. Normal mood.    Labs on Admission: I have personally reviewed following labs and imaging studies  CBC: Recent Labs  Lab 06/02/17 1152 06/02/17 1206  WBC 5.6  --   NEUTROABS 3.7  --   HGB 15.1 15.3  HCT 47.5 45.0  MCV 97.1  --   PLT 185  --    Basic Metabolic Panel: Recent Labs  Lab 06/02/17 1152 06/02/17 1206  NA 139 139  K 4.2 4.8  CL 105 106  CO2 25  --   GLUCOSE 139* 134*  BUN  23* 33*  CREATININE 1.32* 1.20  CALCIUM 8.7*  --    GFR: Estimated Creatinine Clearance: 43.6 mL/min (by C-G formula based on SCr of 1.2 mg/dL). Liver Function Tests: Recent Labs  Lab 06/02/17 1152  AST 26  ALT 19  ALKPHOS 87  BILITOT 1.2  PROT 6.5  ALBUMIN 3.4*   No results for input(s): LIPASE, AMYLASE in the last 168 hours. No results for input(s): AMMONIA in the last 168 hours. Coagulation Profile: Recent Labs  Lab 06/02/17 1152  INR 1.16   Cardiac Enzymes: No results for input(s): CKTOTAL, CKMB, CKMBINDEX, TROPONINI in the last 168 hours. BNP (last 3 results) No results for input(s): PROBNP in the last 8760 hours. HbA1C: No results for input(s): HGBA1C in the last 72 hours. CBG: No results for input(s): GLUCAP in the last 168 hours. Lipid Profile: No results for input(s): CHOL, HDL, LDLCALC, TRIG, CHOLHDL, LDLDIRECT in the last 72 hours. Thyroid Function Tests: No results for input(s): TSH, T4TOTAL, FREET4, T3FREE, THYROIDAB in the last 72 hours. Anemia Panel: No results for input(s): VITAMINB12, FOLATE, FERRITIN, TIBC, IRON, RETICCTPCT in the last 72 hours. Urine analysis:    Component Value Date/Time   BILIRUBINUR n 10/30/2015 0857   PROTEINUR n 10/30/2015 0857   UROBILINOGEN 1.0 10/30/2015 0857   NITRITE n 10/30/2015 0857   LEUKOCYTESUR Negative 10/30/2015 0857   Sepsis Labs: @LABRCNTIP (procalcitonin:4,lacticidven:4) )No results found for this or any previous visit (from the past 240 hour(s)).   Radiological Exams on Admission: Ct Angio Head W Or Wo Contrast  Result Date: 06/02/2017 CLINICAL DATA:  3 minutes episode of aphasia this morning at approximately 10:30 a.m. Arterial stricture/occlusion of the head or neck. EXAM: CT ANGIOGRAPHY HEAD AND NECK TECHNIQUE: Multidetector CT imaging of the head and neck was performed using the standard protocol during bolus administration of intravenous contrast. Multiplanar CT image reconstructions and MIPs were  obtained to evaluate the vascular anatomy. Carotid stenosis measurements (when applicable) are obtained utilizing NASCET criteria, using the distal internal carotid diameter as the denominator. CONTRAST:  30mL ISOVUE-370 IOPAMIDOL (ISOVUE-370) INJECTION 76% COMPARISON:  CT head without contrast 05/18/2003 FINDINGS: CT HEAD FINDINGS Brain: Mild atrophy and white matter changes are within normal limits for age. No acute infarct, hemorrhage, or mass lesion is present. The ventricles are of normal size. No significant  extraaxial fluid collection is present. The brainstem and cerebellum are within normal limits bilaterally. Vascular: Atherosclerotic calcifications are present in the cavernous internal carotid arteries bilaterally. Calcifications are present at the dural margin of the vertebral arteries bilaterally. There is no hyperdense vessel. Skull: Acute the calvarium is intact. No focal lytic or blastic lesions are present. Sinuses: Mild mucosal thickening is present in the maxillary sinuses bilaterally. Wall thickening of the right maxillary sinus appears chronic. Orbits: Bilateral lens replacements are present. Globes and orbits are within normal limits. Review of the MIP images confirms the above findings CTA NECK FINDINGS Aortic arch: A 3 vessel arch configuration is present. Vascular calcifications are present at the aortic arch without aneurysm. There is no significant stenosis of the great vessel origins. Right carotid system: The right common carotid artery is tortuous. Focal calcification is present just before the bifurcation. There is no significant stenosis in the cervical right ICA. The bifurcation is otherwise normal. The cervical right ICA is within normal limits. Left carotid system: The left common carotid and carotid artery is within normal limits. Atherosclerotic disease is present in the proximal left ICA without a significant stenosis relative to the more distal vessel. Vertebral arteries:  Calcifications are present at the origin of the dominant left vertebral artery without a significant stenosis. The right vertebral artery origin is within normal limits at the right subclavian artery the left vertebral artery is dominant in the neck. There is no focal stenosis to either vertebral artery. Skeleton: There is chronic loss of disc height throughout the cervical spine. Uncovertebral and foraminal narrowing is most evident at C3-4 and C5-6. No focal lytic or blastic lesions are present. Other neck: No focal mucosal or submucosal lesions are present. Salivary glands are within normal limits. There is no significant adenopathy. Upper chest: Mild diffuse ground-glass attenuation likely represents atelectasis or edema. There is no focal nodule or mass lesion. Review of the MIP images confirms the above findings CTA HEAD FINDINGS Anterior circulation: Atherosclerotic changes are present within the cavernous internal carotid arteries bilaterally without a significant stenosis relative to the more distal vessel. The A1 and M1 segments are normal. The anterior communicating artery is patent. The MCA bifurcation is intact. ACA and MCA branch vessels are within normal limits. Posterior circulation: Left vertebral artery is the dominant vessel. PICA origins are visualized and normal. Both posterior cerebral arteries originate from the basilar tip. There is asymmetric attenuation of distal right PCA branch vessels. Significant proximal stenosis or occlusion is present. Venous sinuses: The dural sinuses are patent. The straight sinus and deep cerebral veins are intact. Cortical veins are unremarkable. Anatomic variants: None Delayed phase: The postcontrast images demonstrate no pathologic enhancement. Review of the MIP images confirms the above findings IMPRESSION: 1. Atherosclerotic changes involving the carotid bifurcations, cavernous internal carotid arteries, and aortic arch without significant stenosis. 2.  Asymmetric attenuation of distal right PCA branch vessels without a significant proximal stenosis or occlusion. 3. Mild diffuse distal MCA branch vessel disease as well. 4. Multilevel spondylosis of the cervical spine. 5. Diffuse ground-glass attenuation of the lungs suggesting edema or atelectasis. Electronically Signed   By: San Morelle M.D.   On: 06/02/2017 15:33   Ct Angio Neck W Or Wo Contrast  Result Date: 06/02/2017 CLINICAL DATA:  3 minutes episode of aphasia this morning at approximately 10:30 a.m. Arterial stricture/occlusion of the head or neck. EXAM: CT ANGIOGRAPHY HEAD AND NECK TECHNIQUE: Multidetector CT imaging of the head and neck was performed using  the standard protocol during bolus administration of intravenous contrast. Multiplanar CT image reconstructions and MIPs were obtained to evaluate the vascular anatomy. Carotid stenosis measurements (when applicable) are obtained utilizing NASCET criteria, using the distal internal carotid diameter as the denominator. CONTRAST:  68mL ISOVUE-370 IOPAMIDOL (ISOVUE-370) INJECTION 76% COMPARISON:  CT head without contrast 05/18/2003 FINDINGS: CT HEAD FINDINGS Brain: Mild atrophy and white matter changes are within normal limits for age. No acute infarct, hemorrhage, or mass lesion is present. The ventricles are of normal size. No significant extraaxial fluid collection is present. The brainstem and cerebellum are within normal limits bilaterally. Vascular: Atherosclerotic calcifications are present in the cavernous internal carotid arteries bilaterally. Calcifications are present at the dural margin of the vertebral arteries bilaterally. There is no hyperdense vessel. Skull: Acute the calvarium is intact. No focal lytic or blastic lesions are present. Sinuses: Mild mucosal thickening is present in the maxillary sinuses bilaterally. Wall thickening of the right maxillary sinus appears chronic. Orbits: Bilateral lens replacements are present.  Globes and orbits are within normal limits. Review of the MIP images confirms the above findings CTA NECK FINDINGS Aortic arch: A 3 vessel arch configuration is present. Vascular calcifications are present at the aortic arch without aneurysm. There is no significant stenosis of the great vessel origins. Right carotid system: The right common carotid artery is tortuous. Focal calcification is present just before the bifurcation. There is no significant stenosis in the cervical right ICA. The bifurcation is otherwise normal. The cervical right ICA is within normal limits. Left carotid system: The left common carotid and carotid artery is within normal limits. Atherosclerotic disease is present in the proximal left ICA without a significant stenosis relative to the more distal vessel. Vertebral arteries: Calcifications are present at the origin of the dominant left vertebral artery without a significant stenosis. The right vertebral artery origin is within normal limits at the right subclavian artery the left vertebral artery is dominant in the neck. There is no focal stenosis to either vertebral artery. Skeleton: There is chronic loss of disc height throughout the cervical spine. Uncovertebral and foraminal narrowing is most evident at C3-4 and C5-6. No focal lytic or blastic lesions are present. Other neck: No focal mucosal or submucosal lesions are present. Salivary glands are within normal limits. There is no significant adenopathy. Upper chest: Mild diffuse ground-glass attenuation likely represents atelectasis or edema. There is no focal nodule or mass lesion. Review of the MIP images confirms the above findings CTA HEAD FINDINGS Anterior circulation: Atherosclerotic changes are present within the cavernous internal carotid arteries bilaterally without a significant stenosis relative to the more distal vessel. The A1 and M1 segments are normal. The anterior communicating artery is patent. The MCA bifurcation is  intact. ACA and MCA branch vessels are within normal limits. Posterior circulation: Left vertebral artery is the dominant vessel. PICA origins are visualized and normal. Both posterior cerebral arteries originate from the basilar tip. There is asymmetric attenuation of distal right PCA branch vessels. Significant proximal stenosis or occlusion is present. Venous sinuses: The dural sinuses are patent. The straight sinus and deep cerebral veins are intact. Cortical veins are unremarkable. Anatomic variants: None Delayed phase: The postcontrast images demonstrate no pathologic enhancement. Review of the MIP images confirms the above findings IMPRESSION: 1. Atherosclerotic changes involving the carotid bifurcations, cavernous internal carotid arteries, and aortic arch without significant stenosis. 2. Asymmetric attenuation of distal right PCA branch vessels without a significant proximal stenosis or occlusion. 3. Mild diffuse distal  MCA branch vessel disease as well. 4. Multilevel spondylosis of the cervical spine. 5. Diffuse ground-glass attenuation of the lungs suggesting edema or atelectasis. Electronically Signed   By: San Morelle M.D.   On: 06/02/2017 15:33    EKG: (Independently reviewed) sinus rhythm with ventricular rate 80 bpm, QTC 558 ms, low voltage R waves in V1 through V3, nonspecific ST changes in inferior lateral leads unchanged from previous  Assessment/Plan Principal Problem:   Altered mental status/ Aphasia -Patient presents with transient unresponsiveness without loss of consciousness and difficulty speaking- now back to baseline -Appreciate neurology assistance -Differential includes TIA vs CVA vs seizure -MRI/MRA brain -Echocardiogram -Underwent CTA head and neck so no indication to pursue carotid duplex -EEG -Seizure precautions -Frequent neurological checks -Antiplatelet with aspirin -Patient was taking atorvastatin 80 mg prior to admission -HgbA1c/FLP -PT/OT/SLP  evaluation -Wife reports patient with poor oral intake especially of fluids-?  Preadmission symptoms secondary to orthostasis-check OVS  Active Problems:   Benign essential tremor -Unable to tolerate beta-blocker in context of requirement for carvedilol for underlying cardiovascular dz -Had unwanted side effects from Mysoline so no longer taking -Has difficulty eating/drinking secondary to tremor-? above OT evaluation can address appropriate eating utensils and other utensils for assisting with ADLs in context of tremor    Hypertension -Current blood pressure controlled -Continue preadmission carvedilol and lisinopril-if imaging does demonstrate acute CVA likely will need to hold these medications and allow for permissive hypertension    Ischemic cardiomyopathy -Last EF 30% obtained with Myoview 2013 -On beta-blocker and ACE inhibitor prior to admission but not on diuretic -Currently compensated without evidence of acute heart failure -Follow-up on above echocardiogram -Dr. Stanford Breed is primary cardiologist    HLD (hyperlipidemia) -Continue atorvastatin    CKD (chronic kidney disease) stage 3, GFR 30-59 ml/min -Current renal function at baseline with GFR between 48 and 52    CAD (coronary artery disease) -History of MI December 8366 complicated by ventricular fibrillation arrest -Status post PCI to LAD -Continue preadmission aspirin, statin, beta-blocker    BPH (benign prostatic hyperplasia) -Denies current issues with urinary retention    Scalp lesion/rash -Patient with known actinic keratosis followed by outpatient dermatologist and previous excisions -Wife reports new issues with pruritic lesions on anterior scalp with associated redness-utilized ointment provided by dermatologist to treat actinic keratosis after excision but did not improve symptoms-also used OTC Neosporin which decreased redness but did not cause vesicular lesions to resolve -May need to treat empirically for  staph/strep secondary infection-if able may need to obtain culture although since not deep wound likely will contain primarily contaminants -Patient/wife instructed to follow-up with dermatologist after discharge      DVT prophylaxis: Lovenox Code Status: Full code Family Communication: Wife and daughter Disposition Plan: Home Consults called: Neurology/Arora    Samella Parr ANP-BC [facility] Pager [phone]   If 7PM-7AM, please contact night-coverage [email or web address] Password [facility]  06/02/2017, 4:31 PM

## 2017-06-02 NOTE — ED Provider Notes (Signed)
[facility] EMERGENCY DEPARTMENT Provider Note   CSN: [id number] Arrival date & time: 06/02/17  1141     History   Chief Complaint Chief Complaint  Patient presents with  . Transient Ischemic Attack    HPI Eleazar YEHUDA PRINTUP is a 84 y.o. male CAD status post stents, hypertension, hyperlipidemia, MI who presented with trouble speaking, slurred speech.  Around 10:30 AM, patient was eating with family and had an acute onset of trouble speaking and staring into space that lasted about a minute.  Patient did not remember that episode and when he woke up, EMS was around him.  Wife witnessed the episode and did not notice any seizure-like activity.  Patient does have a history of CAD but no previous strokes in the past. Patient is not currently on blood thinners.   The history is provided by the patient.    Past Medical History:  Diagnosis Date  . Allergy   . BPH (benign prostatic hyperplasia)   . CAD (coronary artery disease)   . Cardiomyopathy   . GERD (gastroesophageal reflux disease)   . HYPERLIPIDEMIA   . Hypertension   . Myocardial infarction ([place]) 2004  . RESPIRATORY FAILURE   . Skin cancer    unknown if BCC or SCC  . Tremor   . UTI     Patient Active Problem List   Diagnosis Date Noted  . BPH associated with nocturia 08/29/2013  . Preop cardiovascular exam 09/05/2012  . CARDIOMYOPATHY 06/19/2009  . Hyperlipidemia 04/16/2008  . Essential hypertension 04/16/2008  . Coronary atherosclerosis 04/16/2008  . GERD 04/16/2008    Past Surgical History:  Procedure Laterality Date  . CARDIAC CATHETERIZATION  04-2003   stents  . CATARACT EXTRACTION Bilateral   . TONSILLECTOMY         Home Medications    Prior to Admission medications   Medication Sig Start Date End Date Taking? Authorizing Provider  acidophilus (RISAQUAD) CAPS capsule Take 1 capsule by mouth daily.   Yes [provider]  Alpha-D-Galactosidase Satira Mccallum) TABS Take 3 tablets by  mouth 2 (two) times daily as needed (gas).   Yes [provider]  aspirin 325 MG EC tablet Take 325 mg by mouth daily.     Yes [provider]  atorvastatin (LIPITOR) 80 MG tablet TAKE 1 TABLET BY MOUTH EVERY DAY 05/06/17  Yes Nafziger, [name], NP  carvedilol (COREG) 12.5 MG tablet TAKE 1 TABLET (12.5 MG TOTAL) BY MOUTH 2 (TWO) TIMES DAILY WITH A MEAL. 11/23/16  Yes Nafziger, [name], NP  lisinopril (PRINIVIL,ZESTRIL) 2.5 MG tablet TAKE 1 TABLET TWICE A DAY AT 10 AM AND 5 PM 12/07/16  Yes Nafziger, [name], NP  ranitidine (ZANTAC) 150 MG capsule TAKE ONE CAPSULE BY MOUTH EVERY EVENING 11/15/14  Yes Dorena Cookey, MD  nitroGLYCERIN (NITROSTAT) 0.4 MG SL tablet Place 1 tablet (0.4 mg total) under the tongue every 5 (five) minutes as needed. 08/29/13   Dorena Cookey, MD  primidone (MYSOLINE) 50 MG tablet 1/2 pill each bedtime x 2 weeks, then 1 pill nightly x 2 weeks, then 1 1/2 pills nightly x 2 weeks, then 2 pills nightly thereafter. Patient taking differently: Take 100 mg by mouth at bedtime. 1/2 pill each bedtime x 2 weeks, then 1 pill nightly x 2 weeks, then 1 1/2 pills nightly x 2 weeks, then 2 pills nightly thereafter. 01/12/17   Star Age, MD    Family History Family History  Problem Relation Age of Onset  .  Coronary artery disease Father   . Hypertension Father   . Heart attack Father   . Skin cancer Father     Social History Social History   Tobacco Use  . Smoking status: Former Smoker    Packs/day: 1.00    Years: 30.00    Pack years: 30.00    Types: Cigarettes    Last attempt to quit: 07/27/1980    Years since quitting: 36.8  . Smokeless tobacco: Never Used  Substance Use Topics  . Alcohol use: No    Alcohol/week: 0.0 oz  . Drug use: No     Allergies   Sulfonamide derivatives   Review of Systems Review of Systems  Neurological: Positive for speech difficulty.  All other systems reviewed and are negative.    Physical Exam Updated Vital Signs BP  136/88   Pulse 65   Temp 97.8 F (36.6 C)   Resp 13   Ht 5\' 7"  (1.702 m)   Wt 68 kg (150 lb)   SpO2 97%   BMI 23.49 kg/m   Physical Exam  Constitutional: He is oriented to person, place, and time.  Chronically ill   HENT:  Head: Normocephalic.  Eyes: Conjunctivae and EOM are normal. Pupils are equal, round, and reactive to light.  Neck: Normal range of motion. Neck supple.  Cardiovascular: Normal rate, regular rhythm and normal heart sounds.  Pulmonary/Chest: Effort normal and breath sounds normal. No stridor. No respiratory distress. He has no wheezes.  Abdominal: Soft. Bowel sounds are normal. He exhibits no distension. There is no tenderness. There is no guarding.  Musculoskeletal: Normal range of motion.  Neurological: He is alert and oriented to person, place, and time.  CN 2-12 intact, ? Mild R facial droop but unchanged per family. Some resting tremors, improved with movement. No obvious dysmetria. Nl strength throughout   Skin: Skin is warm.  Psychiatric: He has a normal mood and affect.  Nursing note and vitals reviewed.    ED Treatments / Results  Labs (all labs ordered are listed, but only abnormal results are displayed) Labs Reviewed  COMPREHENSIVE METABOLIC PANEL - Abnormal; Notable for the following components:      Result Value   Glucose, Bld 139 (*)    BUN 23 (*)    Creatinine, Ser 1.32 (*)    Calcium 8.7 (*)    Albumin 3.4 (*)    GFR calc non Af Amer 48 (*)    GFR calc Af Amer 56 (*)    All other components within normal limits  I-STAT CHEM 8, ED - Abnormal; Notable for the following components:   BUN 33 (*)    Glucose, Bld 134 (*)    Calcium, Ion 1.02 (*)    All other components within normal limits  PROTIME-INR  APTT  CBC  DIFFERENTIAL  ETHANOL  RAPID URINE DRUG SCREEN, HOSP PERFORMED  URINALYSIS, ROUTINE W REFLEX MICROSCOPIC  I-STAT TROPONIN, ED    EKG  EKG Interpretation  Date/Time:  Thursday June 02 2017 11:50:25 EST Ventricular  Rate:  80 PR Interval:    QRS Duration: 65 QT Interval:  483 QTC Calculation: 558 R Axis:   77 Text Interpretation:  Sinus rhythm Probable anterior infarct, age indeterminate Prolonged QT interval No significant change since last tracing Confirmed by Wandra Arthurs [phone]) on 06/02/2017 11:59:20 AM       Radiology Ct Angio Head W Or Wo Contrast  Result Date: 06/02/2017 CLINICAL DATA:  3 minutes episode of aphasia this  morning at approximately 10:30 a.m. Arterial stricture/occlusion of the head or neck. EXAM: CT ANGIOGRAPHY HEAD AND NECK TECHNIQUE: Multidetector CT imaging of the head and neck was performed using the standard protocol during bolus administration of intravenous contrast. Multiplanar CT image reconstructions and MIPs were obtained to evaluate the vascular anatomy. Carotid stenosis measurements (when applicable) are obtained utilizing NASCET criteria, using the distal internal carotid diameter as the denominator. CONTRAST:  44mL ISOVUE-370 IOPAMIDOL (ISOVUE-370) INJECTION 76% COMPARISON:  CT head without contrast 05/18/2003 FINDINGS: CT HEAD FINDINGS Brain: Mild atrophy and white matter changes are within normal limits for age. No acute infarct, hemorrhage, or mass lesion is present. The ventricles are of normal size. No significant extraaxial fluid collection is present. The brainstem and cerebellum are within normal limits bilaterally. Vascular: Atherosclerotic calcifications are present in the cavernous internal carotid arteries bilaterally. Calcifications are present at the dural margin of the vertebral arteries bilaterally. There is no hyperdense vessel. Skull: Acute the calvarium is intact. No focal lytic or blastic lesions are present. Sinuses: Mild mucosal thickening is present in the maxillary sinuses bilaterally. Wall thickening of the right maxillary sinus appears chronic. Orbits: Bilateral lens replacements are present. Globes and orbits are within normal limits. Review of the MIP  images confirms the above findings CTA NECK FINDINGS Aortic arch: A 3 vessel arch configuration is present. Vascular calcifications are present at the aortic arch without aneurysm. There is no significant stenosis of the great vessel origins. Right carotid system: The right common carotid artery is tortuous. Focal calcification is present just before the bifurcation. There is no significant stenosis in the cervical right ICA. The bifurcation is otherwise normal. The cervical right ICA is within normal limits. Left carotid system: The left common carotid and carotid artery is within normal limits. Atherosclerotic disease is present in the proximal left ICA without a significant stenosis relative to the more distal vessel. Vertebral arteries: Calcifications are present at the origin of the dominant left vertebral artery without a significant stenosis. The right vertebral artery origin is within normal limits at the right subclavian artery the left vertebral artery is dominant in the neck. There is no focal stenosis to either vertebral artery. Skeleton: There is chronic loss of disc height throughout the cervical spine. Uncovertebral and foraminal narrowing is most evident at C3-4 and C5-6. No focal lytic or blastic lesions are present. Other neck: No focal mucosal or submucosal lesions are present. Salivary glands are within normal limits. There is no significant adenopathy. Upper chest: Mild diffuse ground-glass attenuation likely represents atelectasis or edema. There is no focal nodule or mass lesion. Review of the MIP images confirms the above findings CTA HEAD FINDINGS Anterior circulation: Atherosclerotic changes are present within the cavernous internal carotid arteries bilaterally without a significant stenosis relative to the more distal vessel. The A1 and M1 segments are normal. The anterior communicating artery is patent. The MCA bifurcation is intact. ACA and MCA branch vessels are within normal limits.  Posterior circulation: Left vertebral artery is the dominant vessel. PICA origins are visualized and normal. Both posterior cerebral arteries originate from the basilar tip. There is asymmetric attenuation of distal right PCA branch vessels. Significant proximal stenosis or occlusion is present. Venous sinuses: The dural sinuses are patent. The straight sinus and deep cerebral veins are intact. Cortical veins are unremarkable. Anatomic variants: None Delayed phase: The postcontrast images demonstrate no pathologic enhancement. Review of the MIP images confirms the above findings IMPRESSION: 1. Atherosclerotic changes involving the carotid bifurcations,  cavernous internal carotid arteries, and aortic arch without significant stenosis. 2. Asymmetric attenuation of distal right PCA branch vessels without a significant proximal stenosis or occlusion. 3. Mild diffuse distal MCA branch vessel disease as well. 4. Multilevel spondylosis of the cervical spine. 5. Diffuse ground-glass attenuation of the lungs suggesting edema or atelectasis. Electronically Signed   By: San Morelle M.D.   On: 06/02/2017 15:33   Ct Angio Neck W Or Wo Contrast  Result Date: 06/02/2017 CLINICAL DATA:  3 minutes episode of aphasia this morning at approximately 10:30 a.m. Arterial stricture/occlusion of the head or neck. EXAM: CT ANGIOGRAPHY HEAD AND NECK TECHNIQUE: Multidetector CT imaging of the head and neck was performed using the standard protocol during bolus administration of intravenous contrast. Multiplanar CT image reconstructions and MIPs were obtained to evaluate the vascular anatomy. Carotid stenosis measurements (when applicable) are obtained utilizing NASCET criteria, using the distal internal carotid diameter as the denominator. CONTRAST:  54mL ISOVUE-370 IOPAMIDOL (ISOVUE-370) INJECTION 76% COMPARISON:  CT head without contrast 05/18/2003 FINDINGS: CT HEAD FINDINGS Brain: Mild atrophy and white matter changes are  within normal limits for age. No acute infarct, hemorrhage, or mass lesion is present. The ventricles are of normal size. No significant extraaxial fluid collection is present. The brainstem and cerebellum are within normal limits bilaterally. Vascular: Atherosclerotic calcifications are present in the cavernous internal carotid arteries bilaterally. Calcifications are present at the dural margin of the vertebral arteries bilaterally. There is no hyperdense vessel. Skull: Acute the calvarium is intact. No focal lytic or blastic lesions are present. Sinuses: Mild mucosal thickening is present in the maxillary sinuses bilaterally. Wall thickening of the right maxillary sinus appears chronic. Orbits: Bilateral lens replacements are present. Globes and orbits are within normal limits. Review of the MIP images confirms the above findings CTA NECK FINDINGS Aortic arch: A 3 vessel arch configuration is present. Vascular calcifications are present at the aortic arch without aneurysm. There is no significant stenosis of the great vessel origins. Right carotid system: The right common carotid artery is tortuous. Focal calcification is present just before the bifurcation. There is no significant stenosis in the cervical right ICA. The bifurcation is otherwise normal. The cervical right ICA is within normal limits. Left carotid system: The left common carotid and carotid artery is within normal limits. Atherosclerotic disease is present in the proximal left ICA without a significant stenosis relative to the more distal vessel. Vertebral arteries: Calcifications are present at the origin of the dominant left vertebral artery without a significant stenosis. The right vertebral artery origin is within normal limits at the right subclavian artery the left vertebral artery is dominant in the neck. There is no focal stenosis to either vertebral artery. Skeleton: There is chronic loss of disc height throughout the cervical spine.  Uncovertebral and foraminal narrowing is most evident at C3-4 and C5-6. No focal lytic or blastic lesions are present. Other neck: No focal mucosal or submucosal lesions are present. Salivary glands are within normal limits. There is no significant adenopathy. Upper chest: Mild diffuse ground-glass attenuation likely represents atelectasis or edema. There is no focal nodule or mass lesion. Review of the MIP images confirms the above findings CTA HEAD FINDINGS Anterior circulation: Atherosclerotic changes are present within the cavernous internal carotid arteries bilaterally without a significant stenosis relative to the more distal vessel. The A1 and M1 segments are normal. The anterior communicating artery is patent. The MCA bifurcation is intact. ACA and MCA branch vessels are within normal limits.  Posterior circulation: Left vertebral artery is the dominant vessel. PICA origins are visualized and normal. Both posterior cerebral arteries originate from the basilar tip. There is asymmetric attenuation of distal right PCA branch vessels. Significant proximal stenosis or occlusion is present. Venous sinuses: The dural sinuses are patent. The straight sinus and deep cerebral veins are intact. Cortical veins are unremarkable. Anatomic variants: None Delayed phase: The postcontrast images demonstrate no pathologic enhancement. Review of the MIP images confirms the above findings IMPRESSION: 1. Atherosclerotic changes involving the carotid bifurcations, cavernous internal carotid arteries, and aortic arch without significant stenosis. 2. Asymmetric attenuation of distal right PCA branch vessels without a significant proximal stenosis or occlusion. 3. Mild diffuse distal MCA branch vessel disease as well. 4. Multilevel spondylosis of the cervical spine. 5. Diffuse ground-glass attenuation of the lungs suggesting edema or atelectasis. Electronically Signed   By: San Morelle M.D.   On: 06/02/2017 15:33     Procedures Procedures (including critical care time)  Medications Ordered in ED Medications  sodium chloride 0.9 % bolus 500 mL (500 mLs Intravenous New Bag/Given 06/02/17 1225)  iopamidol (ISOVUE-370) 76 % injection (50 mLs  Contrast Given 06/02/17 1444)     Initial Impression / Assessment and Plan / ED Course  I have reviewed the triage vital signs and the nursing notes.  Pertinent labs & imaging results that were available during my care of the patient were reviewed by me and considered in my medical decision making (see chart for details).    VERDIE BARROWS is a 84 y.o. male here with trouble speaking that resolved. No obvious slurred speech on exam. ? R facial droop but family states that his face always looks like that. I called Dr. Rory Percy on arrival. He saw patient on arrival and since his NIH scale is 0, no code stroke is activated and patient doesn't need TPA. Will get labs, CT angio head/neck. Dr. Rory Percy recommend admission for stroke/TIA/seizure workup.   3:36 PM Labs unremarkable. CT head showed no bleed. CTA unremarkable. Neuro recommend TIA workup, EEG as well to r/o seizure. Hospitalist to admit.    Final Clinical Impressions(s) / ED Diagnoses   Final diagnoses:  TIA (transient ischemic attack)    ED Discharge Orders    None       Drenda Freeze, MD 06/02/17 1536

## 2017-06-03 ENCOUNTER — Other Ambulatory Visit: Payer: Self-pay

## 2017-06-03 ENCOUNTER — Encounter (HOSPITAL_COMMUNITY): Payer: Self-pay

## 2017-06-03 ENCOUNTER — Observation Stay (HOSPITAL_BASED_OUTPATIENT_CLINIC_OR_DEPARTMENT_OTHER): Payer: Medicare Other

## 2017-06-03 DIAGNOSIS — G459 Transient cerebral ischemic attack, unspecified: Secondary | ICD-10-CM | POA: Diagnosis not present

## 2017-06-03 DIAGNOSIS — I255 Ischemic cardiomyopathy: Secondary | ICD-10-CM | POA: Diagnosis not present

## 2017-06-03 DIAGNOSIS — I10 Essential (primary) hypertension: Secondary | ICD-10-CM | POA: Diagnosis not present

## 2017-06-03 DIAGNOSIS — G250 Essential tremor: Secondary | ICD-10-CM

## 2017-06-03 LAB — DIFFERENTIAL
BASOS PCT: 0 %
BLASTS: 0 %
Band Neutrophils: 0 %
EOS ABS: 0.1 10*3/uL (ref 0.0–0.7)
EOS PCT: 2 %
Lymphocytes Relative: 20 %
Lymphs Abs: 1.3 10*3/uL (ref 0.7–4.0)
Metamyelocytes Relative: 0 %
Monocytes Absolute: 0.5 10*3/uL (ref 0.1–1.0)
Monocytes Relative: 7 %
Myelocytes: 0 %
Neutro Abs: 4.5 10*3/uL (ref 1.7–7.7)
Neutrophils Relative %: 71 %
OTHER: 0 %
PROMYELOCYTES ABS: 0 %
nRBC: 0 /100 WBC

## 2017-06-03 LAB — LIPID PANEL
CHOL/HDL RATIO: 2.6 ratio
Cholesterol: 108 mg/dL (ref 0–200)
HDL: 42 mg/dL (ref >40)
LDL Cholesterol: 55 mg/dL (ref 0–99)
Triglycerides: 57 mg/dL (ref <150)
VLDL: 11 mg/dL (ref 0–40)

## 2017-06-03 LAB — ECHOCARDIOGRAM COMPLETE
Height: 67 in
WEIGHTICAEL: 2400 [oz_av]

## 2017-06-03 LAB — HEMOGLOBIN A1C
Hgb A1c MFr Bld: 6.2 % — ABNORMAL HIGH (ref 4.8–5.6)
MEAN PLASMA GLUCOSE: 131.24 mg/dL

## 2017-06-03 MED ORDER — CLOPIDOGREL BISULFATE 75 MG PO TABS: 75.0000 mg | ORAL_TABLET | Freq: Every day | ORAL | 0 refills | Status: DC

## 2017-06-03 MED ORDER — PERFLUTREN LIPID MICROSPHERE
1.0000 mL | INTRAVENOUS | Status: AC | PRN
  Administered 2017-06-03: 2 mL via INTRAVENOUS

## 2017-06-03 NOTE — Evaluation (Signed)
Occupational Therapy Evaluation Patient Details Name: [name] [name] MRN: [id number] DOB: 08/23/1931 Today's Date: 06/03/2017    History of Present Illness 84 y.o. male with medical history significant for hypertension, myocardial infarction December 2004 with V. fib arrest-subsequent PCI to LAD, ischemic cardiomyopathy with EF 30% based on Myoview thousand 13, new diagnosis of benign essential tremor with patient demonstrating intolerance to Mysoline, dyslipidemia and BPH. Brought to ED after transient episode of unresponsiveness that lasted for approximately 10 minutes.  There is no jerking noted however patient was nonresponsive to his wife when she was talking. CT head showed no bleed. CTA unremarkable. Normal EEG in the awake state   Clinical Impression   Pt at independent baseline level of function. Pt with hx of essential tremors and reports occasional difficulty with using R UE. Educated pt and his wife on using weighted utensils and 1-2 lb wrist weights during activities. All education completed and no further acute OT is indicated at this time    Follow Up Recommendations  No OT follow up    Equipment Recommendations  None recommended by OT    Recommendations for Other Services       Precautions / Restrictions Precautions Precautions: None Restrictions Weight Bearing Restrictions: No      Mobility Bed Mobility Overal bed mobility: Modified Independent                Transfers Overall transfer level: Independent Equipment used: None                  Balance Overall balance assessment: Modified Independent                                         ADL either performed or assessed with clinical judgement   ADL Overall ADL's : At baseline;Independent                                             Vision Baseline Vision/History: Wears glasses Wears Glasses: Reading only Patient Visual Report: No change from  baseline       Perception     Praxis      Pertinent Vitals/Pain Pain Assessment: No/denies pain     Hand Dominance Right   Extremity/Trunk Assessment Upper Extremity Assessment Upper Extremity Assessment: Overall WFL for tasks assessed;RUE deficits/detail RUE Deficits / Details: hx of essential tremors   Lower Extremity Assessment Lower Extremity Assessment: Defer to PT evaluation   Cervical / Trunk Assessment Cervical / Trunk Assessment: Normal   Communication Communication Communication: No difficulties   Cognition Arousal/Alertness: Awake/alert Behavior During Therapy: WFL for tasks assessed/performed Overall Cognitive Status: Within Functional Limits for tasks assessed                                     General Comments       Exercises     Shoulder Instructions      Home Living Family/patient expects to be discharged to:: Private residence Living Arrangements: Spouse/significant other Available Help at Discharge: Family;Available 24 hours/day Type of Home: House Home Access: Stairs to enter [employer] of Steps: 4   Home Layout: Laundry or work area in basement;Able to live on main level  with bedroom/bathroom;Multi-level Alternate Level Stairs-Number of Steps: 12   Bathroom Shower/Tub: Teacher, early years/pre: Standard Bathroom Accessibility: Yes   Home Equipment: Tub bench;Bedside commode      Lives With: Spouse    Prior Functioning/Environment Level of Independence: Independent        Comments: community ambulator, driver and independent with ADLs and iADLs,         OT Problem List: Decreased coordination      OT Treatment/Interventions:      OT Goals(Current goals can be found in the care plan section) Acute Rehab OT Goals Patient Stated Goal: go home soon OT Goal Formulation: With patient/family  OT Frequency:     Barriers to D/C:    no barriers       Co-evaluation               AM-PAC PT "6 Clicks" Daily Activity     Outcome Measure Help from another person eating meals?: None Help from another person taking care of personal grooming?: None Help from another person toileting, which includes using toliet, bedpan, or urinal?: None Help from another person bathing (including washing, rinsing, drying)?: None Help from another person to put on and taking off regular upper body clothing?: None Help from another person to put on and taking off regular lower body clothing?: None 6 Click Score: 24   End of Session    Activity Tolerance: Patient tolerated treatment well Patient left: in bed  OT Visit Diagnosis: Other symptoms and signs involving the nervous system (R29.898)                Time: 3875-6433 OT Time Calculation (min): 26 min Charges:  OT General Charges $OT Visit: 1 Visit OT Evaluation $OT Eval Moderate Complexity: 1 Mod G-Codes: OT G-codes **NOT FOR INPATIENT CLASS** Functional Assessment Tool Used: AM-PAC 6 Clicks Daily Activity     Britt Bottom 06/03/2017, 1:52 PM

## 2017-06-03 NOTE — Consult Note (Signed)
NEUROHOSPITALISTS STROKE TEAM - DAILY PROGRESS NOTE   ADMISSION HISTORY: [name] [name] is an 84 y.o. male with past medical history of hyperlipidemia, hypertension and CAD.  Patient apparently was eating breakfast this morning with his wife around 67 when he suddenly stopped talking, was not moving, did not respond to her thus EMS was called.  At approximately 10 minutes later when EMS arrived patient was back to his baseline and knew where he was and able to talk to EMS.  On arrival to the emergency department patient was back to his baseline.  Patient has never had a seizure in the past nor stroke per wife.  Code stroke was go to be called however neurology entered the room and noted there was a NIH of 0 thus it was not called.  Date last known well: Date: 06/02/2017 Time last known well: Time: 10:30 tPA Given: No: Symptoms resolved NIH stroke scale of 0 Modified Rankin: Rankin Score=0  SUBJECTIVE (INTERVAL HISTORY)  Wife is at the bedside. Patient is found laying in bed in NAD. Overall he feels his condition is improved. Voices no new complaints. No new events reported overnight.   OBJECTIVE Lab Results: CBC:  Recent Labs  Lab 06/02/17 1152 06/02/17 1206  WBC 5.6  --   HGB 15.1 15.3  HCT 47.5 45.0  MCV 97.1  --   PLT 185  --    BMP: Recent Labs  Lab 06/02/17 1152 06/02/17 1206  NA 139 139  K 4.2 4.8  CL 105 106  CO2 25  --   GLUCOSE 139* 134*  BUN 23* 33*  CREATININE 1.32* 1.20  CALCIUM 8.7*  --    Liver Function Tests:  Recent Labs  Lab 06/02/17 1152  AST 26  ALT 19  ALKPHOS 87  BILITOT 1.2  PROT 6.5  ALBUMIN 3.4*   Coagulation Studies:  Recent Labs    06/02/17 1152  APTT 28  INR 1.16   Urinalysis:  Recent Labs  Lab 06/02/17 1152  COLORURINE YELLOW  APPEARANCEUR CLEAR  LABSPEC 1.028  PHURINE 8.0  GLUCOSEU NEGATIVE  HGBUR NEGATIVE  BILIRUBINUR NEGATIVE  KETONESUR NEGATIVE  PROTEINUR  NEGATIVE  NITRITE NEGATIVE  LEUKOCYTESUR NEGATIVE   Urine Drug Screen:     Component Value Date/Time   LABOPIA NONE DETECTED 06/02/2017 1152   COCAINSCRNUR NONE DETECTED 06/02/2017 1152   LABBENZ NONE DETECTED 06/02/2017 1152   AMPHETMU NONE DETECTED 06/02/2017 1152   THCU NONE DETECTED 06/02/2017 1152   LABBARB NONE DETECTED 06/02/2017 1152    Alcohol Level:  Recent Labs  Lab 06/02/17 1805  ETH <10   PHYSICAL EXAM Temp:  [97.7 F (36.5 C)-98.7 F (37.1 C)] 98.1 F (36.7 C) (01/25 0951) Pulse Rate:  [60-86] 74 (01/25 0951) Resp:  [7-19] 18 (01/25 0951) BP: (109-172)/(64-97) 133/73 (01/25 0951) SpO2:  [92 %-100 %] 98 % (01/25 0951) Weight:  [150 lb (68 kg)] 150 lb (68 kg) (01/24 1150) General - Well nourished, well developed, in no apparent distress HEENT-  Normocephalic,  Cardiovascular - Regular rate and rhythm  Respiratory - Lungs clear bilaterally. No wheezing. Abdomen - soft and non-tender, BS normal Extremities- no edema or cyanosis Neurological Examination Mental Status: Alert, oriented, thought content appropriate.  Speech fluent without evidence of aphasia.  Able to follow 3 step commands without difficulty. Cranial Nerves: MP:NTIRWE fields grossly normal,  III,IV, VI: ptosis not present, extra-ocular motions intact bilaterally, pupils equal, round, reactive to light and accommodation V,VII: smile symmetric, facial light  touch sensation normal bilaterally VIII: hearing impaired bilaterally IX,X: uvula rises symmetrically XI: bilateral shoulder shrug XII: midline tongue extension Motor: Right : Upper extremity   5/5    Left:     Upper extremity   5/5  Lower extremity   5/5     Lower extremity   5/5 Postural and resting tremor in bilateral upper extremities especially at the wrists and hands Sensory: Pinprick and light touch intact throughout, bilaterally  IMAGING: I have personally reviewed the radiological images below and agree with the radiology  interpretations. Ct Angio Head/Neck W Or [name] Contrast Result Date: 06/02/2017 IMPRESSION: 1. Atherosclerotic changes involving the carotid bifurcations, cavernous internal carotid arteries, and aortic arch without significant stenosis. 2. Asymmetric attenuation of distal right PCA branch vessels without a significant proximal stenosis or occlusion. 3. Mild diffuse distal MCA branch vessel disease as well. 4. Multilevel spondylosis of the cervical spine. 5. Diffuse ground-glass attenuation of the lungs suggesting edema or atelectasis. Electronically Signed   By: San Morelle M.D.   On: 06/02/2017 15:33   [name] [name] Contrast Result Date: 06/02/2017 IMPRESSION: 1. No acute intracranial abnormality and largely unremarkable for age noncontrast MRI appearance of the brain. 2. Due to a scanner malfunction, axial T1 and coronal T2 weighted imaging was not obtained. 3. Intracranial MRA is reported separately. Electronically Signed   By: Genevie Ann M.D.   On: 06/02/2017 18:07   [name] [name] Head [name] Contrast Result Date: 06/02/2017 IMPRESSION: 1. Normal for age intracranial MRA. 2. Brain MRI today is reported separately. Electronically Signed   By: Genevie Ann M.D.   On: 06/02/2017 18:17   Echocardiogram:                                              PENDING  EEG:    Impression:  This is a normal EEG in the awake state.  There is no evidence of a seizure tendency on this recording, however, this does not rule out underlying epilepsy.  If seizures remain a clinical concern, then a sleep deprived EEG and/or more prolonged EEG may be of additional value.                                                                  IMPRESSION: [name]. RODERT [name] is a 84 y.o. male with PMH of HTN, HLD, CAD with transient episode of unresponsiveness that lasted for approximately 10 minutes.  There is no jerking noted however patient was nonresponsive to his wife when she was talking.  MRI and CTA negative for acute  findings  Possible TIA vs syncopal episode Suspected Etiology: small vessel Resultant Symptoms: AMS Stroke Risk Factors: hyperlipidemia and hypertension Other Stroke Risk Factors: Advanced age, CAD  Outstanding Stroke Work-up Studies:     Echocardiogram:                                                    PENDING  If echo unremarkable, stroke team will  sign off.   PLAN  06/03/2017: Continue Aspirin/ Statin. Consider changing ASA to Plavix on discharge. Frequent neuro checks Telemetry monitoring PT/OT/SLP Ongoing aggressive stroke risk factor management Patient counseled to be compliant with his antithrombotic medications Patient counseled on Lifestyle modifications including, Diet, Exercise, and Stress Follow up with [facility] in 6 weeks Cecille Rubin  R/O SEIZURES: EEG- Negative No seizure activity reported overnight  HYPERTENSION: Stable Long term BP goal normotensive. May slowly restart home B/P medications  Home Meds: Lisinopril, Coreg  HYPERLIPIDEMIA:    Component Value Date/Time   CHOL 108 06/03/2017 0616   TRIG 57 06/03/2017 0616   TRIG 52 04/20/2006 1107   HDL 42 06/03/2017 0616   CHOLHDL 2.6 06/03/2017 0616   VLDL 11 06/03/2017 0616   LDLCALC 55 06/03/2017 0616   Home Meds:  Lipitor 80 mg LDL  goal < 70 Continued on  Lipitor to 80 mg daily Continue statin at discharge  PRE- DIABETES: Lab Results  Component Value Date   HGBA1C 6.2 (H) 06/03/2017  HgbA1c goal < 7.0 Continue CBG monitoring and SSI to maintain glucose 140-180 mg/dl DM education   Other Active Problems: Principal Problem:   Altered mental status Active Problems:   Aphasia   Hypertension   CAD (coronary artery disease)   Cardiomyopathy   BPH (benign prostatic hyperplasia)   HLD (hyperlipidemia)   Benign essential tremor   Scalp lesion   CKD (chronic kidney disease) stage 3, GFR 30-59 ml/min [facility])    Hospital day # 0 VTE prophylaxis: Lovenox Diet : Seizure  precautions Fall precautions Diet Heart Room service appropriate? Yes; Fluid consistency: Thin   FAMILY UPDATES:  family at bedside  TEAM UPDATES: Patrecia Pour, Christean Grief, MD   Prior Home Stroke Medications: aspirin 325 mg daily  Discharge Stroke Meds:  Please discharge patient on Plavix 75mg  daily unless contraindicated  Disposition:  Therapy Recs:               PENDING Follow Up:  Follow-up Information    Dennie Bible, NP. Schedule an appointment as soon as possible for a visit in 6 week(s).   Specialty:  Family Medicine Contact information: [address] [place] [address] [phone]          Dorothyann Peng, NP -PCP Follow up in 1-2 weeks       Attending Note: 06/03/2017 ASSESSMENT:    Personally examined patient and images, and have participated in and made any corrections needed to history, physical, neuro exam,assessment and plan as stated above.  I have personally obtained the history, evaluated lab date, reviewed imaging studies and agree with radiology interpretations.    Sarina Ill, MD Stroke Neurology Team 06/03/2017 11:05 AM  Neurology to sign-off at this time. Please call with any further questions or concerns. Thank you for this consultation.  To contact Stroke Continuity provider, please refer to [email or web address] After hours, contact General Neurology

## 2017-06-03 NOTE — Evaluation (Signed)
Speech Language Pathology Evaluation Patient Details Name: [name] [name] MRN: [id number] DOB: [birth year] Today's Date: 06/03/2017 Time: 6578-4696 SLP Time Calculation (min) (ACUTE ONLY): 18 min  Problem List:  Patient Active Problem List   Diagnosis Date Noted  . Altered mental status 06/02/2017  . Aphasia 06/02/2017  . Hypertension 06/02/2017  . CAD (coronary artery disease) 06/02/2017  . Cardiomyopathy 06/02/2017  . BPH (benign prostatic hyperplasia) 06/02/2017  . HLD (hyperlipidemia) 06/02/2017  . Benign essential tremor 06/02/2017  . Scalp lesion 06/02/2017  . CKD (chronic kidney disease) stage 3, GFR 30-59 ml/min (HCC) 06/02/2017  . BPH associated with nocturia 08/29/2013  . Preop cardiovascular exam 09/05/2012  . CARDIOMYOPATHY 06/19/2009  . Hyperlipidemia 04/16/2008  . Essential hypertension 04/16/2008  . Coronary atherosclerosis 04/16/2008  . GERD 04/16/2008   Past Medical History:  Past Medical History:  Diagnosis Date  . Allergy   . BPH (benign prostatic hyperplasia)   . CAD (coronary artery disease)   . Cardiomyopathy   . GERD (gastroesophageal reflux disease)   . HYPERLIPIDEMIA   . Hypertension   . Myocardial infarction ([place]) 2004  . RESPIRATORY FAILURE   . Skin cancer    unknown if BCC or SCC  . Tremor   . UTI    Past Surgical History:  Past Surgical History:  Procedure Laterality Date  . CARDIAC CATHETERIZATION  04-2003   stents  . CATARACT EXTRACTION Bilateral   . TONSILLECTOMY     HPI:  84 y.o. male with medical history significant for hypertension, myocardial infarction December 2004 with V. fib arrest-subsequent PCI to LAD, ischemic cardiomyopathy with EF 30% based on Myoview thousand 13, new diagnosis of benign essential tremor with patient demonstrating intolerance to Mysoline, dyslipidemia and BPH. Brought to ED after transient episode of unresponsiveness that lasted for approximately 10 minutes.  There is no jerking noted however patient  was nonresponsive to his wife when she was talking. CT head showed no bleed. CTA unremarkable. Normal EEG in the awake state   Assessment / Plan / Recommendation Clinical Impression   Pt is at baseline for cognitive-linguistic function.  Pt endorses mild, baseline memory deficits since 2004 myocardial infarct which are not presently worsened.  Pt demonstrates no areas of oral motor weakness or sensory loss.  His speech is fluent and free from dysarthria or word finding impairment.  As result, no further ST needs are indicated at this time.      SLP Assessment  SLP Recommendation/Assessment: Patient does not need any further Speech Lanaguage Pathology Services             SLP Evaluation Cognition  Overall Cognitive Status: Within Functional Limits for tasks assessed       Comprehension  Auditory Comprehension Overall Auditory Comprehension: Appears within functional limits for tasks assessed Visual Recognition/Discrimination Discrimination: Within Function Limits Reading Comprehension Reading Status: Within funtional limits    Expression Expression Primary Mode of Expression: Verbal Verbal Expression Overall Verbal Expression: Appears within functional limits for tasks assessed Written Expression Dominant Hand: Right   Oral / Motor  Oral Motor/Sensory Function Overall Oral Motor/Sensory Function: Within functional limits Motor Speech Overall Motor Speech: Appears within functional limits for tasks assessed   GO          Functional Assessment Tool Used: cognitive-linguistic evaluation completed  Functional Limitations: Memory Memory Current Status (E9528): At least 1 percent but less than 20 percent impaired, limited or restricted Memory Goal Status (U1324): At least 1 percent but less than 20 percent  impaired, limited or restricted Memory Discharge Status [phone]): At least 1 percent but less than 20 percent impaired, limited or restricted         Emilio Math 06/03/2017,  1:31 PM

## 2017-06-03 NOTE — Care Management Note (Signed)
Case Management Note  Patient Details  Name: [name] [name] MRN: [id number] Date of Birth: 1932/03/14  Subjective/Objective:   Pt in with altered mental status. He is from home with spouse.                   Action/Plan: Awaiting PT/OT evals. CM following for d/c needs, physician orders.   Expected Discharge Date:  06/03/17               Expected Discharge Plan:     In-House Referral:     Discharge planning Services     Post Acute Care Choice:    Choice offered to:     DME Arranged:    DME Agency:     HH Arranged:    HH Agency:     Status of Service:  In process, will continue to follow  If discussed at Long Length of Stay Meetings, dates discussed:    Additional Comments:  Pollie Friar, RN 06/03/2017, 11:24 AM

## 2017-06-03 NOTE — Discharge Summary (Signed)
Physician Discharge Summary  [name] [name]  IOX:[id number]  DOB: Oct 24, 1931  DOA: 06/02/2017 PCP: Dorothyann Peng, NP  Admit date: 06/02/2017 Discharge date: 06/03/2017  Admitted From: Home  Disposition:  Home   Recommendations for Outpatient Follow-up:  1. Follow up with PCP in 1-2 weeks 2. Follow up with neurology in 4-6 weeks  3. Please obtain BMP/CBC in one week to monitor Cr and Hgb   Discharge Condition: Stable   CODE STATUS: Full code  Diet recommendation: Heart Healthy   Brief/Interim Summary: For full details see H&P/Progress note but in brief, [name] [name] is a 84 year old male with medical history significant for hypertension, ischemic cardiomyopathy with EF of 30%, essential tremor and BPH presented to the emergency department complaining of difficulty speaking and confusion.  Wife reported events happen while he was having breakfast and episode lasted about 10-50 minutes by that time of EMS arrival patient was back to baseline.  Upon ED evaluation CT was negative for acute abnormality neurology was consulted and recommended evaluation for TIA/stroke versus seizure.  EEG was done which was unremarkable, MRI of the brain was negative.  CTA of the neck and head shows atherosclerotic changes involving the carotid bifurcation but no significant stenosis.  Echocardiogram was done which showed no significant changes, EF remains unchanged.  Patient was evaluated by physical therapy and no further recommendations were made.  Patient back to his baseline and was deemed stable to be discharged home.  Subjective: Patient seen and examined, has no complaints this time.  Symptoms has resolved.  Denies chest pain, shortness of breath, palpitation, dizziness, weakness and difficulty speaking  Discharge Diagnoses/Hospital Course:  TIA Felt to be secondary to small vessel disease MRI negative for acute brain abnormalities CTA head and neck with atherosclerotic changes Prior to admission on  aspirin, will recommend to change to Plavix 75 mg daily EEG was performed, unremarkable no signs of seizures LDL 55 - Continue statin Aggressive risk factor modification, lifestyle changes  Ischemic cardiomyopathy Most recent echocardiogram shows EF of 30%, new echocardiogram and performed today EF of 25-30% Currently on beta-blocker and ACE inhibitor, no signs of fluid overload at this time Continue home medications with no changes follow-up with primary cardiologist in outpatient  CKD stage III Creatinine currently at baseline Monitor as an outpatient  CAD Asymptomatic Follow-up as an outpatient  Hypertension Blood pressure currently well controlled Continue home medications without any further changes  Essential Tremors  OT evaluation with no further recommendation Continue beta-blocker Follow-up with PCP  BPH Stable  All other chronic medical condition were stable during the hospitalization.  Patient was seen by physical therapy, no further recommendations  On the day of the discharge the patient's vitals were stable, and no other acute medical condition were reported by patient. the patient was felt safe to be discharge to home   Discharge Instructions  You were cared for by a hospitalist during your hospital stay. If you have any questions about your discharge medications or the care you received while you were in the hospital after you are discharged, you can call the unit and asked to speak with the hospitalist on call if the hospitalist that took care of you is not available. Once you are discharged, your primary care physician will handle any further medical issues. Please note that NO REFILLS for any discharge medications will be authorized once you are discharged, as it is imperative that you return to your primary care physician (or establish a relationship with a  primary care physician if you do not have one) for your aftercare needs so that they can reassess your  need for medications and monitor your lab values.  Discharge Instructions    Ambulatory referral to Neurology   Complete by:  As directed    An appointment is requested in approximately: 6 weeks Follow up with stroke clinic Cecille Rubin preferred, if not available, then consider Caesar Chestnut, [facility] or Jaynee Eagles whoever is available) at [facility] in about 6-8 weeks. Thanks.   Call MD for:  difficulty breathing, headache or visual disturbances   Complete by:  As directed    Call MD for:  extreme fatigue   Complete by:  As directed    Call MD for:  hives   Complete by:  As directed    Call MD for:  persistant dizziness or light-headedness   Complete by:  As directed    Call MD for:  persistant nausea and vomiting   Complete by:  As directed    Call MD for:  redness, tenderness, or signs of infection (pain, swelling, redness, odor or green/yellow discharge around incision site)   Complete by:  As directed    Call MD for:  severe uncontrolled pain   Complete by:  As directed    Call MD for:  temperature >100.4   Complete by:  As directed    Diet - low sodium heart healthy   Complete by:  As directed    Increase activity slowly   Complete by:  As directed      Allergies as of 06/03/2017      Reactions   Sulfonamide Derivatives    REACTION: history      Medication List    STOP taking these medications   aspirin 325 MG EC tablet     TAKE these medications   acidophilus Caps capsule Take 1 capsule by mouth daily.   atorvastatin 80 MG tablet Commonly known as:  LIPITOR TAKE 1 TABLET BY MOUTH EVERY DAY   BEANO Tabs Take 3 tablets by mouth 2 (two) times daily as needed (gas).   carvedilol 12.5 MG tablet Commonly known as:  COREG TAKE 1 TABLET (12.5 MG TOTAL) BY MOUTH 2 (TWO) TIMES DAILY WITH A MEAL.   clopidogrel 75 MG tablet Commonly known as:  PLAVIX Take 1 tablet (75 mg total) by mouth daily.   lisinopril 2.5 MG tablet Commonly known as:  PRINIVIL,ZESTRIL TAKE 1 TABLET  TWICE A DAY AT 10 AM AND 5 PM   nitroGLYCERIN 0.4 MG SL tablet Commonly known as:  NITROSTAT Place 1 tablet (0.4 mg total) under the tongue every 5 (five) minutes as needed.   ranitidine 150 MG capsule Commonly known as:  ZANTAC TAKE ONE CAPSULE BY MOUTH EVERY EVENING      Follow-up Information    Dennie Bible, NP. Schedule an appointment as soon as possible for a visit in 6 week(s).   Specialty:  Family Medicine Contact information: [address] [place] [address] [phone]        Dorothyann Peng, [place]. Schedule an appointment as soon as possible for a visit in 1 week(s).   Specialty:  Family Medicine Why:  Hospital follow up  Contact information: [address] [place] [address] [phone]          Allergies  Allergen Reactions  . Sulfonamide Derivatives     REACTION: history    Consultations:  Neurology    Procedures/Studies: Ct Angio Head W  Or Wo Contrast  Result Date: 06/02/2017 CLINICAL DATA:  3 minutes episode of aphasia this morning at approximately 10:30 a.m. Arterial stricture/occlusion of the head or neck. EXAM: CT ANGIOGRAPHY HEAD AND NECK TECHNIQUE: Multidetector CT imaging of the head and neck was performed using the standard protocol during bolus administration of intravenous contrast. Multiplanar CT image reconstructions and MIPs were obtained to evaluate the vascular anatomy. Carotid stenosis measurements (when applicable) are obtained utilizing NASCET criteria, using the distal internal carotid diameter as the denominator. CONTRAST:  41mL ISOVUE-370 IOPAMIDOL (ISOVUE-370) INJECTION 76% COMPARISON:  CT head without contrast 05/18/2003 FINDINGS: CT HEAD FINDINGS Brain: Mild atrophy and white matter changes are within normal limits for age. No acute infarct, hemorrhage, or mass lesion is present. The ventricles are of normal size. No significant extraaxial fluid collection is present. The brainstem and cerebellum  are within normal limits bilaterally. Vascular: Atherosclerotic calcifications are present in the cavernous internal carotid arteries bilaterally. Calcifications are present at the dural margin of the vertebral arteries bilaterally. There is no hyperdense vessel. Skull: Acute the calvarium is intact. No focal lytic or blastic lesions are present. Sinuses: Mild mucosal thickening is present in the maxillary sinuses bilaterally. Wall thickening of the right maxillary sinus appears chronic. Orbits: Bilateral lens replacements are present. Globes and orbits are within normal limits. Review of the MIP images confirms the above findings CTA NECK FINDINGS Aortic arch: A 3 vessel arch configuration is present. Vascular calcifications are present at the aortic arch without aneurysm. There is no significant stenosis of the great vessel origins. Right carotid system: The right common carotid artery is tortuous. Focal calcification is present just before the bifurcation. There is no significant stenosis in the cervical right ICA. The bifurcation is otherwise normal. The cervical right ICA is within normal limits. Left carotid system: The left common carotid and carotid artery is within normal limits. Atherosclerotic disease is present in the proximal left ICA without a significant stenosis relative to the more distal vessel. Vertebral arteries: Calcifications are present at the origin of the dominant left vertebral artery without a significant stenosis. The right vertebral artery origin is within normal limits at the right subclavian artery the left vertebral artery is dominant in the neck. There is no focal stenosis to either vertebral artery. Skeleton: There is chronic loss of disc height throughout the cervical spine. Uncovertebral and foraminal narrowing is most evident at C3-4 and C5-6. No focal lytic or blastic lesions are present. Other neck: No focal mucosal or submucosal lesions are present. Salivary glands are within  normal limits. There is no significant adenopathy. Upper chest: Mild diffuse ground-glass attenuation likely represents atelectasis or edema. There is no focal nodule or mass lesion. Review of the MIP images confirms the above findings CTA HEAD FINDINGS Anterior circulation: Atherosclerotic changes are present within the cavernous internal carotid arteries bilaterally without a significant stenosis relative to the more distal vessel. The A1 and M1 segments are normal. The anterior communicating artery is patent. The MCA bifurcation is intact. ACA and MCA branch vessels are within normal limits. Posterior circulation: Left vertebral artery is the dominant vessel. PICA origins are visualized and normal. Both posterior cerebral arteries originate from the basilar tip. There is asymmetric attenuation of distal right PCA branch vessels. Significant proximal stenosis or occlusion is present. Venous sinuses: The dural sinuses are patent. The straight sinus and deep cerebral veins are intact. Cortical veins are unremarkable. Anatomic variants: None Delayed phase: The postcontrast images demonstrate no pathologic enhancement. Review  of the MIP images confirms the above findings IMPRESSION: 1. Atherosclerotic changes involving the carotid bifurcations, cavernous internal carotid arteries, and aortic arch without significant stenosis. 2. Asymmetric attenuation of distal right PCA branch vessels without a significant proximal stenosis or occlusion. 3. Mild diffuse distal MCA branch vessel disease as well. 4. Multilevel spondylosis of the cervical spine. 5. Diffuse ground-glass attenuation of the lungs suggesting edema or atelectasis. Electronically Signed   By: San Morelle M.D.   On: 06/02/2017 15:33   Ct Angio Neck W Or Wo Contrast  Result Date: 06/02/2017 CLINICAL DATA:  3 minutes episode of aphasia this morning at approximately 10:30 a.m. Arterial stricture/occlusion of the head or neck. EXAM: CT ANGIOGRAPHY  HEAD AND NECK TECHNIQUE: Multidetector CT imaging of the head and neck was performed using the standard protocol during bolus administration of intravenous contrast. Multiplanar CT image reconstructions and MIPs were obtained to evaluate the vascular anatomy. Carotid stenosis measurements (when applicable) are obtained utilizing NASCET criteria, using the distal internal carotid diameter as the denominator. CONTRAST:  87mL ISOVUE-370 IOPAMIDOL (ISOVUE-370) INJECTION 76% COMPARISON:  CT head without contrast 05/18/2003 FINDINGS: CT HEAD FINDINGS Brain: Mild atrophy and white matter changes are within normal limits for age. No acute infarct, hemorrhage, or mass lesion is present. The ventricles are of normal size. No significant extraaxial fluid collection is present. The brainstem and cerebellum are within normal limits bilaterally. Vascular: Atherosclerotic calcifications are present in the cavernous internal carotid arteries bilaterally. Calcifications are present at the dural margin of the vertebral arteries bilaterally. There is no hyperdense vessel. Skull: Acute the calvarium is intact. No focal lytic or blastic lesions are present. Sinuses: Mild mucosal thickening is present in the maxillary sinuses bilaterally. Wall thickening of the right maxillary sinus appears chronic. Orbits: Bilateral lens replacements are present. Globes and orbits are within normal limits. Review of the MIP images confirms the above findings CTA NECK FINDINGS Aortic arch: A 3 vessel arch configuration is present. Vascular calcifications are present at the aortic arch without aneurysm. There is no significant stenosis of the great vessel origins. Right carotid system: The right common carotid artery is tortuous. Focal calcification is present just before the bifurcation. There is no significant stenosis in the cervical right ICA. The bifurcation is otherwise normal. The cervical right ICA is within normal limits. Left carotid system: The  left common carotid and carotid artery is within normal limits. Atherosclerotic disease is present in the proximal left ICA without a significant stenosis relative to the more distal vessel. Vertebral arteries: Calcifications are present at the origin of the dominant left vertebral artery without a significant stenosis. The right vertebral artery origin is within normal limits at the right subclavian artery the left vertebral artery is dominant in the neck. There is no focal stenosis to either vertebral artery. Skeleton: There is chronic loss of disc height throughout the cervical spine. Uncovertebral and foraminal narrowing is most evident at C3-4 and C5-6. No focal lytic or blastic lesions are present. Other neck: No focal mucosal or submucosal lesions are present. Salivary glands are within normal limits. There is no significant adenopathy. Upper chest: Mild diffuse ground-glass attenuation likely represents atelectasis or edema. There is no focal nodule or mass lesion. Review of the MIP images confirms the above findings CTA HEAD FINDINGS Anterior circulation: Atherosclerotic changes are present within the cavernous internal carotid arteries bilaterally without a significant stenosis relative to the more distal vessel. The A1 and M1 segments are normal. The anterior communicating artery  is patent. The MCA bifurcation is intact. ACA and MCA branch vessels are within normal limits. Posterior circulation: Left vertebral artery is the dominant vessel. PICA origins are visualized and normal. Both posterior cerebral arteries originate from the basilar tip. There is asymmetric attenuation of distal right PCA branch vessels. Significant proximal stenosis or occlusion is present. Venous sinuses: The dural sinuses are patent. The straight sinus and deep cerebral veins are intact. Cortical veins are unremarkable. Anatomic variants: None Delayed phase: The postcontrast images demonstrate no pathologic enhancement. Review of  the MIP images confirms the above findings IMPRESSION: 1. Atherosclerotic changes involving the carotid bifurcations, cavernous internal carotid arteries, and aortic arch without significant stenosis. 2. Asymmetric attenuation of distal right PCA branch vessels without a significant proximal stenosis or occlusion. 3. Mild diffuse distal MCA branch vessel disease as well. 4. Multilevel spondylosis of the cervical spine. 5. Diffuse ground-glass attenuation of the lungs suggesting edema or atelectasis. Electronically Signed   By: San Morelle M.D.   On: 06/02/2017 15:33   [name]  Result Date: 06/02/2017 CLINICAL DATA:  84 year old male with episode of aphasia and decreased responsiveness during breakfast. Symptoms resolved, but possible TIA. EXAM: MRI HEAD WITHOUT CONTRAST TECHNIQUE: Multiplanar, multiecho pulse sequences of the brain and surrounding structures were obtained without intravenous contrast. COMPARISON:  Head CT and CTA head and neck 1452 hr today. Intracranial MRA, reported separately. FINDINGS: The examination had to be discontinued prior to completion due to a scanner malfunction. Subsequently axial T1 weighted images and coronal T2 weighted images were not obtained. Brain: No restricted diffusion to suggest acute infarction. No midline shift, mass effect, evidence of mass lesion, ventriculomegaly, extra-axial collection or acute intracranial hemorrhage. Cervicomedullary junction and pituitary are within normal limits. No cortical encephalomalacia or chronic cerebral blood products are identified. Minimal to mild for age scattered cerebral white matter T2 and FLAIR hyperintensity, most pronounced in the periatrial white matter. The deep gray matter nuclei, brainstem, and cerebellum appear normal for age. Vascular: Major intracranial vascular flow voids are preserved, the distal left vertebral artery appears dominant. Skull and upper cervical spine: Negative for age visible  cervical spine. Visualized bone marrow signal is within normal limits. Sinuses/Orbits: Postoperative changes to both globes, otherwise normal orbits soft tissues. Paranasal Visualized paranasal sinuses and mastoids are stable and well pneumatized. Other: Scalp and face soft tissues appear negative. IMPRESSION: 1. No acute intracranial abnormality and largely unremarkable for age noncontrast MRI appearance of the brain. 2. Due to a scanner malfunction, axial T1 and coronal T2 weighted imaging was not obtained. 3. Intracranial MRA is reported separately. Electronically Signed   By: Genevie Ann M.D.   On: 06/02/2017 18:07   [name] Wo Contrast  Result Date: 06/02/2017 CLINICAL DATA:  84 year old male with episode of aphasia and decreased responsiveness during breakfast. Symptoms resolved, but possible TIA. EXAM: MRA HEAD WITHOUT CONTRAST TECHNIQUE: Angiographic images of the Circle of Willis were obtained using MRA technique without intravenous contrast. COMPARISON:  Brain MRI today reported separately . CTA head and neck 1452 hr today. FINDINGS: Antegrade flow in the posterior circulation. Dominant distal left vertebral artery. Normal PICA origins. No distal vertebral stenosis. Patent vertebrobasilar junction and basilar artery without stenosis. Normal SCA and PCA origins. Posterior communicating arteries are diminutive or absent. Bilateral PCA branch flow signal appears symmetric and within normal limits. Antegrade flow in both ICA siphons. No ICA stenosis. Normal ophthalmic artery origins. Patent carotid termini. Normal MCA and ACA origins. Visible bilateral  ACA branches are normal. Mildly tortuous left MCA M1 segment, with a caudally directed MCA bifurcation. Left MCA branches appear stable to the earlier CTA and within normal limits. Right MCA M1 segment is also mildly tortuous. Right MCA bifurcation and visible right MCA branches are stable and within normal limits. IMPRESSION: 1. Normal for age intracranial  MRA. 2. Brain MRI today is reported separately. Electronically Signed   By: Genevie Ann M.D.   On: 06/02/2017 18:17   ECHO  ------------------------------------------------------------------- Study Conclusions  - Left ventricle: The cavity size was at the upper limits of   normal. Wall thickness was normal. Systolic function was severely   reduced. The estimated ejection fraction was in the range of 25%   to 30%. Akinesis and aneurysmal deformity of the   mid-apicalanteroseptal, anterior, and apical myocardium;   consistent with infarction in the distribution of the left   anterior descending coronary artery. Doppler parameters are   consistent with abnormal left ventricular relaxation (grade 1   diastolic dysfunction). No evidence of thrombus. Acoustic   contrast opacification revealed no evidence ofthrombus. - Mitral valve: Calcified annulus. - Left atrium: The atrium was mildly dilated.  Impressions:  - There is little change from the report from 2005. The images are   not available for direct comparison   Discharge Exam: Vitals:   06/03/17 0600 06/03/17 0951  BP: (!) 147/78 133/73  Pulse: 69 74  Resp: 18 18  Temp: 98.7 F (37.1 C) 98.1 F (36.7 C)  SpO2: 96% 98%   Vitals:   06/03/17 0200 06/03/17 0400 06/03/17 0600 06/03/17 0951  BP: (!) 144/81 (!) 145/77 (!) 147/78 133/73  Pulse: 72 70 69 74  Resp: 18 18 18 18   Temp: 98.7 F (37.1 C) 98.7 F (37.1 C) 98.7 F (37.1 C) 98.1 F (36.7 C)  TempSrc: Oral Oral Oral Oral  SpO2: 97% 97% 96% 98%  Weight:      Height:        General: Pt is alert, awake, not in acute distress Cardiovascular: RRR, S1/S2 +, no rubs, no gallops Respiratory: CTA bilaterally, no wheezing, no rhonchi Abdominal: Soft, NT, ND, bowel sounds + Extremities: no edema, no cyanosis Neurology: Non focal, Strength 5/5  The results of significant diagnostics from this hospitalization (including imaging, microbiology, ancillary and laboratory) are  listed below for reference.    Microbiology: No results found for this or any previous visit (from the past 240 hour(s)).   Labs: BNP (last 3 results) No results for input(s): BNP in the last 8760 hours. Basic Metabolic Panel: Recent Labs  Lab 06/02/17 1152 06/02/17 1206  NA 139 139  K 4.2 4.8  CL 105 106  CO2 25  --   GLUCOSE 139* 134*  BUN 23* 33*  CREATININE 1.32* 1.20  CALCIUM 8.7*  --    Liver Function Tests: Recent Labs  Lab 06/02/17 1152  AST 26  ALT 19  ALKPHOS 87  BILITOT 1.2  PROT 6.5  ALBUMIN 3.4*   No results for input(s): LIPASE, AMYLASE in the last 168 hours. No results for input(s): AMMONIA in the last 168 hours. CBC: Recent Labs  Lab 06/02/17 1152 06/02/17 1206 06/03/17 0616  WBC 5.6  --   --   NEUTROABS 3.7  --  4.5  HGB 15.1 15.3  --   HCT 47.5 45.0  --   MCV 97.1  --   --   PLT 185  --   --    Cardiac Enzymes:  No results for input(s): CKTOTAL, CKMB, CKMBINDEX, TROPONINI in the last 168 hours. BNP: Invalid input(s): POCBNP CBG: No results for input(s): GLUCAP in the last 168 hours. D-Dimer No results for input(s): DDIMER in the last 72 hours. Hgb A1c Recent Labs    06/03/17 0616  HGBA1C 6.2*   Lipid Profile Recent Labs    06/03/17 0616  CHOL 108  HDL 42  LDLCALC 55  TRIG 57  CHOLHDL 2.6   Thyroid function studies No results for input(s): TSH, T4TOTAL, T3FREE, THYROIDAB in the last 72 hours.  Invalid input(s): FREET3 Anemia work up No results for input(s): VITAMINB12, FOLATE, FERRITIN, TIBC, IRON, RETICCTPCT in the last 72 hours. Urinalysis    Component Value Date/Time   COLORURINE YELLOW 06/02/2017 1152   APPEARANCEUR CLEAR 06/02/2017 1152   LABSPEC 1.028 06/02/2017 1152   PHURINE 8.0 06/02/2017 1152   GLUCOSEU NEGATIVE 06/02/2017 1152   HGBUR NEGATIVE 06/02/2017 1152   BILIRUBINUR NEGATIVE 06/02/2017 1152   BILIRUBINUR n 10/30/2015 0857   KETONESUR NEGATIVE 06/02/2017 1152   PROTEINUR NEGATIVE 06/02/2017 1152    UROBILINOGEN 1.0 10/30/2015 0857   NITRITE NEGATIVE 06/02/2017 1152   LEUKOCYTESUR NEGATIVE 06/02/2017 1152   Sepsis Labs Invalid input(s): PROCALCITONIN,  WBC,  LACTICIDVEN Microbiology No results found for this or any previous visit (from the past 240 hour(s)).   Time coordinating discharge: 32 minutes  SIGNED:  Chipper Oman, MD  [facility] 06/03/2017, 4:25 PM  Pager please text page via  [email or web address]

## 2017-06-03 NOTE — Care Management Note (Signed)
Case Management Note  Patient Details  Name: [name] [name] MRN: [id number] Date of Birth: 1931/09/17  Subjective/Objective:                    Action/Plan: Pt discharging home with self care. Pt has PCP, insurance and transportation home. No further needs per CM.  Expected Discharge Date:  06/03/17               Expected Discharge Plan:  Home/Self Care  In-House Referral:     Discharge planning Services     Post Acute Care Choice:    Choice offered to:     DME Arranged:    DME Agency:     HH Arranged:    HH Agency:     Status of Service:  Completed, signed off  If discussed at [employer] of Stay Meetings, dates discussed:    Additional Comments:  Pollie Friar, RN 06/03/2017, 4:22 PM

## 2017-06-03 NOTE — Progress Notes (Signed)
Echocardiogram 2D Echocardiogram has been performed.  [name] [name] 06/03/2017, 2:32 PM

## 2017-06-03 NOTE — Progress Notes (Signed)
Discharge instructions reviewed with pt and wife.  Copy of instructions and script given to pt.  Pt waiting for ride to arrive, family picking pt and wife up.    At 1715 Pt d/c'd via wheelchair with belongings, with wife.          Escorted by unit NT.   Pt d/c'd with stroke information booklet and other handouts on stroke/TIA, and on plavix medication.

## 2017-06-03 NOTE — Care Management Obs Status (Signed)
[facility] NOTIFICATION   Patient Details  Name: [name] [name] MRN: [id number] Date of Birth: 07/17/1931   Medicare Observation Status Notification Given:  Yes    Carles Collet, RN 06/03/2017, 1:51 PM

## 2017-06-03 NOTE — Evaluation (Signed)
Physical Therapy Evaluation Patient Details Name: [name] [name] MRN: [id number] DOB: [birth year] Today's Date: 06/03/2017   History of Present Illness  84 y.o. male with medical history significant for hypertension, myocardial infarction December 2004 with V. fib arrest-subsequent PCI to LAD, ischemic cardiomyopathy with EF 30% based on Myoview thousand 13, new diagnosis of benign essential tremor with patient demonstrating intolerance to Mysoline, dyslipidemia and BPH. Brought to ED after transient episode of unresponsiveness that lasted for approximately 10 minutes.  There is no jerking noted however patient was nonresponsive to his wife when she was talking. CT head showed no bleed. CTA unremarkable. Normal EEG in the awake state  Clinical Impression  Patient evaluated by Physical Therapy with no further acute PT needs identified. All education has been completed and the patient has no further questions. Pt is at baseline level of function, mod I with bed mobility, independent with transfers, ambulation of 300 feet without AD, ascent/descent of 5 steps with use of bilateral rails. Pt also scored 22/24 on DGI indicating decreased risk of fall. PT is signing off. Thank you for this referral.     Follow Up Recommendations No PT follow up    Equipment Recommendations  None recommended by PT    Recommendations for Other Services       Precautions / Restrictions Precautions Precautions: None Restrictions Weight Bearing Restrictions: No      Mobility  Bed Mobility Overal bed mobility: Modified Independent             General bed mobility comments: use of bed rails to pull to EoB  Transfers Overall transfer level: Independent Equipment used: None             General transfer comment: good power up and steadying in standing  Ambulation/Gait Ambulation/Gait assistance: Modified independent (Device/Increase time) Ambulation Distance (Feet): 300 Feet Assistive device:  None Gait Pattern/deviations: Step-through pattern;Shuffle;Drifts right/left Gait velocity: slowed Gait velocity interpretation: Below normal speed for age/gender General Gait Details: Mod I for increased time, slight drifting right and left, no LoB  Stairs Stairs: Yes Stairs assistance: Modified independent (Device/Increase time) Stair Management: Two rails;Alternating pattern;Forwards Number of Stairs: 5 General stair comments: stron, steady, ascent/descent     Balance Overall balance assessment: Modified Independent                               Standardized Balance Assessment Standardized Balance Assessment : Dynamic Gait Index   Dynamic Gait Index Level Surface: Normal Change in Gait Speed: Mild Impairment Gait with Horizontal Head Turns: Mild Impairment Gait with Vertical Head Turns: Normal Gait and Pivot Turn: Normal Step Over Obstacle: Normal Step Around Obstacles: Normal Steps: Normal Total Score: 22       Pertinent Vitals/Pain Pain Assessment: No/denies pain    Home Living Family/patient expects to be discharged to:: Private residence Living Arrangements: Spouse/significant other Available Help at Discharge: Family;Available 24 hours/day Type of Home: House Home Access: Stairs to enter Entrance Stairs-Rails: Can reach both Entrance Stairs-Number of Steps: 4 Home Layout: Laundry or work area in basement;Able to live on main level with bedroom/bathroom;Multi-level Home Equipment: Tub bench;Bedside commode      Prior Function Level of Independence: Independent         Comments: community ambulator, driver and independent with ADLs and iADLs,      Hand Dominance   Dominant Hand: Right    Extremity/Trunk Assessment   Upper Extremity Assessment Upper Extremity  Assessment: Overall WFL for tasks assessed    Lower Extremity Assessment Lower Extremity Assessment: RLE deficits/detail;LLE deficits/detail RLE Deficits / Details: AROM  WFL, strength grossly 4+/5 RLE Sensation: (intact) RLE Coordination: (WFL) LLE Sensation: (intact) LLE Coordination: (WFL)       Communication   Communication: No difficulties  Cognition Arousal/Alertness: Awake/alert Behavior During Therapy: WFL for tasks assessed/performed Overall Cognitive Status: Within Functional Limits for tasks assessed                                        General Comments General comments (skin integrity, edema, etc.): Wife present during evaluation,         Assessment/Plan    PT Assessment Patent does not need any further PT services         PT Goals (Current goals can be found in the Care Plan section)  Acute Rehab PT Goals Patient Stated Goal: go home soon PT Goal Formulation: With patient Time For Goal Achievement: 06/17/17    Frequency     Barriers to discharge        Co-evaluation               AM-PAC PT "6 Clicks" Daily Activity  Outcome Measure Difficulty turning over in bed (including adjusting bedclothes, sheets and blankets)?: None Difficulty moving from lying on back to sitting on the side of the bed? : None Difficulty sitting down on and standing up from a chair with arms (e.g., wheelchair, bedside commode, etc,.)?: None Help needed moving to and from a bed to chair (including a wheelchair)?: None Help needed walking in hospital room?: None Help needed climbing 3-5 steps with a railing? : None 6 Click Score: 24    End of Session Equipment Utilized During Treatment: Gait belt Activity Tolerance: Patient tolerated treatment well Patient left: in bed;with call bell/phone within reach;with family/visitor present;Other (comment)(Dr in room) Nurse Communication: Mobility status PT Visit Diagnosis: Other symptoms and signs involving the nervous system (E10.071)    Time: 2197-5883 PT Time Calculation (min) (ACUTE ONLY): 33 min   Charges:   PT Evaluation $PT Eval Moderate Complexity: 1 Mod PT  Treatments $Gait Training: 8-22 mins   PT G Codes:        Mackinze Criado B. Migdalia Dk PT, DPT Acute Rehabilitation  [phone] Pager [phone]    [place] 06/03/2017, 9:49 AM

## 2017-06-06 ENCOUNTER — Telehealth: Payer: Self-pay | Admitting: Family Medicine

## 2017-06-06 NOTE — Telephone Encounter (Signed)
Transition Care Management Follow-up Telephone Call  MERCER PEIFER  JGO:[id number]  DOB: 1931-12-29  DOA: 06/02/2017 PCP: Dorothyann Peng, NP  Admit date: 06/02/2017 Discharge date: 06/03/2017  Admitted From: Home  Disposition:  Home   Recommendations for Outpatient Follow-up:  1. Follow up with PCP in 1-2 weeks 2. Follow up with neurology in 4-6 weeks  3. Please obtain BMP/CBC in one week to monitor Cr and Hgb   Discharge Condition: Stable      How have you been since you were released from the hospital? "okay"   Do you understand why you were in the hospital? yes   Do you understand the discharge instructions? yes   Where were you discharged to? Home   Items Reviewed:  Medications reviewed: yes  Allergies reviewed: yes  Dietary changes reviewed: yes  Referrals reviewed: yes   Functional Questionnaire:   Activities of Daily Living (ADLs):   He states they are independent in the following: ambulation, bathing and hygiene, feeding, continence, grooming, toileting and dressing States they require assistance with the following: none   Any transportation issues/concerns?: no   Any patient concerns? no   Confirmed importance and date/time of follow-up visits scheduled yes  Provider Appointment booked with Dorothyann Peng on 06/09/2017 Thursday at 3:30 pm  Confirmed with patient if condition begins to worsen call PCP or go to the ER.  Patient was given the office number and encouraged to call back with question or concerns.  : yes

## 2017-06-07 ENCOUNTER — Telehealth: Payer: Self-pay | Admitting: Cardiology

## 2017-06-07 NOTE — Telephone Encounter (Signed)
Spoke with pt, aware changing from aspirin to plavix is appropriate.

## 2017-06-07 NOTE — Telephone Encounter (Signed)
Pt's wife called and needs a call back....the patient went to ED 1/24-25 they changed med from [place] to pelvics.  Notes in epic.   Wife what's to know if this is OK?

## 2017-06-09 ENCOUNTER — Ambulatory Visit (INDEPENDENT_AMBULATORY_CARE_PROVIDER_SITE_OTHER): Payer: Medicare Other | Admitting: Adult Health

## 2017-06-09 ENCOUNTER — Other Ambulatory Visit: Payer: Self-pay | Admitting: Adult Health

## 2017-06-09 ENCOUNTER — Encounter: Payer: Self-pay | Admitting: Adult Health

## 2017-06-09 VITALS — BP 130/78 | HR 68 | Temp 97.6°F | Ht 67.0 in | Wt 146.0 lb

## 2017-06-09 DIAGNOSIS — G459 Transient cerebral ischemic attack, unspecified: Secondary | ICD-10-CM

## 2017-06-09 MED ORDER — CLOPIDOGREL BISULFATE 75 MG PO TABS: 75.0000 mg | ORAL_TABLET | Freq: Every day | ORAL | 3 refills | Status: AC

## 2017-06-09 MED ORDER — CLOPIDOGREL BISULFATE 75 MG PO TABS: 75.0000 mg | ORAL_TABLET | Freq: Every day | ORAL | 3 refills | Status: DC

## 2017-06-09 NOTE — Progress Notes (Signed)
Subjective:    Patient ID: [name] [name], male    DOB: 1931/10/16, 84 y.o.   MRN: [id number]  HPI  84 year old male who  has a past medical history of Allergy, BPH (benign prostatic hyperplasia), CAD (coronary artery disease), Cardiomyopathy, GERD (gastroesophageal reflux disease), HYPERLIPIDEMIA, Hypertension, Myocardial infarction ([place]) (2004), RESPIRATORY FAILURE, Skin cancer, Tremor, and UTI. He presents to the office today for TCM visit   He was admitted on 06/02/2017  He was discharged on 06/03/2017  Admitting Diagnosis: TIA  He presented to the emergency room with the acute complaint of difficulty speaking and confusion. His wife provides the history as he does not remember the incident. Per his wife he was downstairs having breakfast when she looked up and noticed that the cup coffee was sitting in his lap and he had coffee spilled on the shirt. He is having difficulty with speaking and was having word finding issues. This episode lasted about 10-15 minutes, by that time EMS had arrived and patient was back to baseline. In the emergency room CT was negative for acute abnormality and neurology was consult that with recommended evaluation for TIA stroke versus seizure.EEG was done which was unremarkable, MRI of the brain was negative. CTA of the head and neck shows atherosclerotic changes involving the carotid bifurcationbut no significant stenosis. Echocardiogram was done which showed no significant changes, EF remains essentially unchanged. He was also evaluated by physical therapy and no further recommendations were made.  The TIA was felt to be secondary to small vessel disease. Prior to admission he was on aspirin, and the recommendation was to change to Plavix 75 mg daily. He was also advised on aggressive risk factor modification, lifestyle changes.  outpatient referral to neurology was completed and he has an appointment scheduled for March 12.   Today in the office he feels as  though he is back to baseline for the most part, he does feel fatigued at times. He denies having any headaches, blurred vision, slurred speech, difficulty walking, or difficulty eating or drinking.  He has no acute complaints today  Review of Systems  Constitutional: Positive for fatigue.  Respiratory: Negative.   Cardiovascular: Negative.   Musculoskeletal: Positive for arthralgias.  Skin: Negative.   Neurological: Negative.   Hematological: Negative.   Psychiatric/Behavioral: Negative.   All other systems reviewed and are negative.  Past Medical History:  Diagnosis Date  . Allergy   . BPH (benign prostatic hyperplasia)   . CAD (coronary artery disease)   . Cardiomyopathy   . GERD (gastroesophageal reflux disease)   . HYPERLIPIDEMIA   . Hypertension   . Myocardial infarction ([place]) 2004  . RESPIRATORY FAILURE   . Skin cancer    unknown if BCC or SCC  . Tremor   . UTI     Social History   Socioeconomic History  . Marital status: Married    Spouse name: Not on file  . Number of children: Not on file  . Years of education: Not on file  . Highest education level: Not on file  Social Needs  . Financial resource strain: Not on file  . Food insecurity - worry: Not on file  . Food insecurity - inability: Not on file  . Transportation needs - medical: Not on file  . Transportation needs - non-medical: Not on file  Occupational History  . Not on file  Tobacco Use  . Smoking status: Former Smoker    Packs/day: 1.00    Years:  30.00    Pack years: 30.00    Types: Cigarettes    Last attempt to quit: 07/27/1980    Years since quitting: 36.8  . Smokeless tobacco: Never Used  Substance and Sexual Activity  . Alcohol use: No    Alcohol/week: 0.0 oz  . Drug use: No  . Sexual activity: No    Birth control/protection: None  Other Topics Concern  . Not on file  Social History Narrative   Retired from working in Office manager   Married for 58 years    He likes to walk  and read       Past Surgical History:  Procedure Laterality Date  . CARDIAC CATHETERIZATION  04-2003   stents  . CATARACT EXTRACTION Bilateral   . TONSILLECTOMY      Family History  Problem Relation Age of Onset  . Coronary artery disease Father   . Hypertension Father   . Heart attack Father   . Skin cancer Father     Allergies  Allergen Reactions  . Sulfonamide Derivatives     REACTION: history    Current Outpatient Medications on File Prior to Visit  Medication Sig Dispense Refill  . acidophilus (RISAQUAD) CAPS capsule Take 1 capsule by mouth daily.    . Alpha-D-Galactosidase (BEANO) TABS Take 3 tablets by mouth 2 (two) times daily as needed (gas).    Marland Kitchen atorvastatin (LIPITOR) 80 MG tablet TAKE 1 TABLET BY MOUTH EVERY DAY 90 tablet 1  . carvedilol (COREG) 12.5 MG tablet TAKE 1 TABLET (12.5 MG TOTAL) BY MOUTH 2 (TWO) TIMES DAILY WITH A MEAL. 200 tablet 2  . lisinopril (PRINIVIL,ZESTRIL) 2.5 MG tablet TAKE 1 TABLET TWICE A DAY AT 10 AM AND 5 PM 180 tablet 3  . nitroGLYCERIN (NITROSTAT) 0.4 MG SL tablet Place 1 tablet (0.4 mg total) under the tongue every 5 (five) minutes as needed. (Patient not taking: Reported on 06/06/2017) 25 tablet 1  . ranitidine (ZANTAC) 150 MG capsule TAKE ONE CAPSULE BY MOUTH EVERY EVENING 100 capsule 3   No current facility-administered medications on file prior to visit.     BP 130/78 (BP Location: Left Arm, Patient Position: Sitting, Cuff Size: Normal)   Pulse 68   Temp 97.6 F (36.4 C) (Oral)   Ht 5\' 7"  (1.702 m)   Wt 146 lb (66.2 kg)   SpO2 96%   BMI 22.87 kg/m       Objective:   Physical Exam  Constitutional: He is oriented to person, place, and time. He appears well-developed and well-nourished. No distress.  Cardiovascular: Normal rate, regular rhythm, normal heart sounds and intact distal pulses. Exam reveals no gallop and no friction rub.  No murmur heard. Neurological: He is alert and oriented to person, place, and time. He  has normal strength. He displays tremor. No cranial nerve deficit or sensory deficit.  Skin: Skin is warm and dry. No rash noted. He is not diaphoretic. No erythema. No pallor.  Psychiatric: He has a normal mood and affect. His behavior is normal. Judgment and thought content normal.  Nursing note and vitals reviewed.     Assessment & Plan:  1. TIA (transient ischemic attack) - spoke at length with patient and his wife about lifestyle modifications. Advised low salt diet, continues salt substitute such as Mrs. Dash. Also advised increasing fruits and vegetables and eating out less often. - Answered any questions they had about her hospital visit and admitting diagnosis. - Reviewed labs and imaging that was done  in the hospital - CBC with Differential/Platelet - Basic Metabolic Panel - clopidogrel (PLAVIX) 75 MG tablet; Take 1 tablet (75 mg total) by mouth daily.  Dispense: 90 tablet; Refill: 3 - Follow up as needed - CPE in July   Dorothyann Peng, NP

## 2017-06-10 LAB — CBC WITH DIFFERENTIAL/PLATELET
BASOS ABS: 0.1 10*3/uL (ref 0.0–0.1)
Basophils Relative: 0.9 % (ref 0.0–3.0)
EOS ABS: 0.2 10*3/uL (ref 0.0–0.7)
Eosinophils Relative: 2.7 % (ref 0.0–5.0)
HCT: 44.9 % (ref 39.0–52.0)
Hemoglobin: 14.7 g/dL (ref 13.0–17.0)
LYMPHS ABS: 1.3 10*3/uL (ref 0.7–4.0)
LYMPHS PCT: 19.7 % (ref 12.0–46.0)
MCHC: 32.8 g/dL (ref 30.0–36.0)
MCV: 93.6 fl (ref 78.0–100.0)
MONO ABS: 0.9 10*3/uL (ref 0.1–1.0)
Monocytes Relative: 12.9 % — ABNORMAL HIGH (ref 3.0–12.0)
NEUTROS ABS: 4.3 10*3/uL (ref 1.4–7.7)
NEUTROS PCT: 63.8 % (ref 43.0–77.0)
PLATELETS: 207 10*3/uL (ref 150.0–400.0)
RBC: 4.79 Mil/uL (ref 4.22–5.81)
RDW: 14.5 % (ref 11.5–15.5)
WBC: 6.7 10*3/uL (ref 4.0–10.5)

## 2017-06-10 LAB — BASIC METABOLIC PANEL
BUN: 25 mg/dL — ABNORMAL HIGH (ref 6–23)
CHLORIDE: 105 meq/L (ref 96–112)
CO2: 28 mEq/L (ref 19–32)
Calcium: 9 mg/dL (ref 8.4–10.5)
Creatinine, Ser: 1.32 mg/dL (ref 0.40–1.50)
GFR: 55.02 mL/min — AB (ref >60.00)
Glucose, Bld: 54 mg/dL — ABNORMAL LOW (ref 70–99)
POTASSIUM: 4.5 meq/L (ref 3.5–5.1)
SODIUM: 141 meq/L (ref 135–145)

## 2017-06-10 NOTE — Progress Notes (Signed)
Spoke with pt and informed him that his blood work is stable. Pt verbalized understanding.

## 2017-07-19 ENCOUNTER — Encounter: Payer: Self-pay | Admitting: Adult Health

## 2017-07-19 ENCOUNTER — Ambulatory Visit (INDEPENDENT_AMBULATORY_CARE_PROVIDER_SITE_OTHER): Payer: Medicare Other | Admitting: Adult Health

## 2017-07-19 VITALS — BP 99/56 | HR 60 | Ht 67.0 in | Wt 144.2 lb

## 2017-07-19 DIAGNOSIS — G459 Transient cerebral ischemic attack, unspecified: Secondary | ICD-10-CM

## 2017-07-19 NOTE — Progress Notes (Signed)
[facility] [address] [place]. [place] [address] [phone]       OFFICE FOLLOW UP NOTE  [name] Date of Birth:  Jul 29, 1931 Medical Record Number:  [id number]   Reason for Referral:  Hospital stroke follow up  CHIEF COMPLAINT:  Chief Complaint  Patient presents with  . Follow-up    Patient reports that he has been doing well since the hospital    HPI: [name] [name] is being seen today for initial visit in the office for TIA on 06/02/17. History obtained from patient, wife and chart review. Reviewed all radiology images and labs personally.  [name] [name] is an 84 y.o. male with past medical history of hyperlipidemia, hypertension and CAD.  Patient apparently was eating breakfast this morning with his wife when he suddenly stopped talking, was not moving, and did not respond to her thus EMS was called.  At approximately 10 minutes later when EMS arrived patient was back to his baseline and knew where he was and able to talk to EMS.  On arrival to the emergency department patient was back to his baseline.  Denies personal and family history of  stroke/TIA or seizures. CT negative for acute hemorrhage. MRI and CTA negative for acute findings. 2D echo with EF of 25-30% (little change from report in 2005). EEG obtained and negative for seizure activity.  Patient was previously taking aspirin and it was recommended that he switch is to Plavix.  LDL 55 -continue Lipitor 80 mg.  A1c 6.2.  Discharged home in stable condition.              Since discharge, patient has done well.  Continues to take Lipitor without increase in myalgias. Continues to take Plavix without increase in bleeding or bruising.  Blood pressure slightly low at today's visit at 99/56.  Patient does check blood pressure at home and typically runs SBP 120s.  Denies symptoms of obstructive sleep apnea such as snoring, apneic episodes, daytime sleepiness/drowsiness, frequent napping, or frequent  waking during the night.  Has been seen in this office prior by Dr. Rexene Alberts for tremor.  At this time, patient has been completely weaned off from Mysoline.  He does have a scheduled appointment in June with Dr. Rexene Alberts.  Denies new or worsening stroke/TIA symptoms.  ROS:   14 system review of systems performed and negative with exception of the nose and cough  PMH:  Past Medical History:  Diagnosis Date  . Allergy   . BPH (benign prostatic hyperplasia)   . CAD (coronary artery disease)   . Cardiomyopathy   . GERD (gastroesophageal reflux disease)   . HYPERLIPIDEMIA   . Hypertension   . Myocardial infarction ([place]) 2004  . RESPIRATORY FAILURE   . Skin cancer    unknown if BCC or SCC  . Tremor   . UTI     PSH:  Past Surgical History:  Procedure Laterality Date  . CARDIAC CATHETERIZATION  04-2003   stents  . CATARACT EXTRACTION Bilateral   . TONSILLECTOMY      Social History:  Social History   Socioeconomic History  . Marital status: Married    Spouse name: Not on file  . Number of children: Not on file  . Years of education: Not on file  . Highest education level: Not on file  Social Needs  . Financial resource strain: Not on file  . Food insecurity - worry: Not on file  . Food insecurity - inability:  Not on file  . Transportation needs - medical: Not on file  . Transportation needs - non-medical: Not on file  Occupational History  . Not on file  Tobacco Use  . Smoking status: Former Smoker    Packs/day: 1.00    Years: 30.00    Pack years: 30.00    Types: Cigarettes    Last attempt to quit: 07/27/1980    Years since quitting: 37.0  . Smokeless tobacco: Never Used  Substance and Sexual Activity  . Alcohol use: No    Alcohol/week: 0.0 oz  . Drug use: No  . Sexual activity: No    Birth control/protection: None  Other Topics Concern  . Not on file  Social History Narrative   Retired from working in Office manager   Married for 58 years    He likes to walk  and read       Family History:  Family History  Problem Relation Age of Onset  . Coronary artery disease Father   . Hypertension Father   . Heart attack Father   . Skin cancer Father     Medications:   Current Outpatient Medications on File Prior to Visit  Medication Sig Dispense Refill  . acidophilus (RISAQUAD) CAPS capsule Take 1 capsule by mouth daily.    . Alpha-D-Galactosidase (BEANO) TABS Take 3 tablets by mouth 2 (two) times daily as needed (gas).    Marland Kitchen atorvastatin (LIPITOR) 80 MG tablet TAKE 1 TABLET BY MOUTH EVERY DAY 90 tablet 1  . carvedilol (COREG) 12.5 MG tablet TAKE 1 TABLET (12.5 MG TOTAL) BY MOUTH 2 (TWO) TIMES DAILY WITH A MEAL. 200 tablet 2  . clopidogrel (PLAVIX) 75 MG tablet Take 1 tablet (75 mg total) by mouth daily. 90 tablet 3  . lisinopril (PRINIVIL,ZESTRIL) 2.5 MG tablet TAKE 1 TABLET TWICE A DAY AT 10 AM AND 5 PM 180 tablet 3  . nitroGLYCERIN (NITROSTAT) 0.4 MG SL tablet Place 1 tablet (0.4 mg total) under the tongue every 5 (five) minutes as needed. 25 tablet 1  . ranitidine (ZANTAC) 150 MG capsule TAKE ONE CAPSULE BY MOUTH EVERY EVENING 100 capsule 3   No current facility-administered medications on file prior to visit.     Allergies:   Allergies  Allergen Reactions  . Sulfonamide Derivatives     REACTION: history     Physical Exam  Vitals:   07/19/17 1339  BP: (!) 99/56  Pulse: 60  Weight: 144 lb 3.2 oz (65.4 kg)  Height: 5\' 7"  (1.702 m)   Body mass index is 22.58 kg/m. No exam data present  General: well developed, elderly Caucasian male, well nourished, seated, in no evident distress Head: head normocephalic and atraumatic.   Neck: supple with no carotid or supraclavicular bruits Cardiovascular: regular rate and rhythm, no murmurs Musculoskeletal: no deformity Skin:  no rash/petichiae Vascular:  Normal pulses all extremities  Neurologic Exam Mental Status: Awake and fully alert. Oriented to place and time. Recent and remote  memory intact. Attention span, concentration and fund of knowledge appropriate. Mood and affect appropriate.  Cranial Nerves: Fundoscopic exam reveals sharp disc margins. Pupils equal, briskly reactive to light. Extraocular movements full without nystagmus. Visual fields full to confrontation. Hearing intact. Facial sensation intact. Face, tongue, palate moves normally and symmetrically.  Motor: Normal bulk and tone. Normal strength in all tested extremity muscles.  Action tremor present in bilateral upper extremities R>L. Sensory.: intact to touch , pinprick , position and vibratory sensation. Coordination: Decreased rapid alternating  movements bilateral upper extremities. Finger-to-nose with tremors bilaterally R>L mild Gait and Station: Arises from chair with difficulty. Stance is normal. Gait demonstrates slow and cautious gait.  Did not test tandem walk. Reflexes: 1+ and symmetric. Toes downgoing.    NIHSS  0  Modified Rankin  1   Diagnostic Data (Labs, Imaging, Testing)  Ct Angio Head/Neck W Or Wo Contrast Result Date: 06/02/2017 IMPRESSION: 1. Atherosclerotic changes involving the carotid bifurcations, cavernous internal carotid arteries, and aortic arch without significant stenosis. 2. Asymmetric attenuation of distal right PCA branch vessels without a significant proximal stenosis or occlusion. 3. Mild diffuse distal MCA branch vessel disease as well. 4. Multilevel spondylosis of the cervical spine. 5. Diffuse ground-glass attenuation of the lungs suggesting edema or atelectasis. Electronically Signed   By: San Morelle M.D.   On: 06/02/2017 15:33   [name] Result Date: 06/02/2017 IMPRESSION: 1. No acute intracranial abnormality and largely unremarkable for age noncontrast MRI appearance of the brain. 2. Due to a scanner malfunction, axial T1 and coronal T2 weighted imaging was not obtained. 3. Intracranial MRA is reported separately. Electronically Signed   By: Genevie Ann M.D.   On: 06/02/2017 18:07   [name] Wo Contrast Result Date: 06/02/2017 IMPRESSION: 1. Normal for age intracranial MRA. 2. Brain MRI today is reported separately. Electronically Signed   By: Genevie Ann M.D.   On: 06/02/2017 18:17   Echocardiogram 06/03/17 - Left ventricle: The cavity size was at the upper limits of   normal. Wall thickness was normal. Systolic function was severely   reduced. The estimated ejection fraction was in the range of 25%   to 30%. Akinesis and aneurysmal deformity of the   mid-apicalanteroseptal, anterior, and apical myocardium;   consistent with infarction in the distribution of the left   anterior descending coronary artery. Doppler parameters are   consistent with abnormal left ventricular relaxation (grade 1   diastolic dysfunction). No evidence of thrombus. Acoustic   contrast opacification revealed no evidence ofthrombus. - Mitral valve: Calcified annulus. - Left atrium: The atrium was mildly dilated.                                    EEG 06/02/17 Impression: This is a normal EEG in the awake state. There is no evidence of a seizure tendency on this recording, however, this does not rule out underlying epilepsy. If seizures remain a clinical concern, then a sleep deprived EEG and/or more prolonged EEG may be of additional value.            ASSESSMENT: [name] [name] is a 84 y.o. year old male here with TIA on 06/02/17 secondary to small vessel disease. Vascular risk factors include HTN and HLD.  Patient denies new stroke/TIA symptoms.  No complaints or concerns at today's visit.   PLAN: -Continue clopidogrel 75 mg daily  and lipitor  for secondary stroke prevention -Follow up with your PCP regarding your HLD and HTN management -Maintain strict control of hypertension with blood pressure goal below 130/90, diabetes with hemoglobin A1c goal below 6.5% and cholesterol with LDL cholesterol (bad cholesterol) goal below 70 mg/dL. I also advised  the patient to eat a healthy diet with plenty of whole grains, cereals, fruits and vegetables, exercise regularly and maintain ideal body weight. -Greater than 50% time during this 25 minute consultation visit was spent on counseling and coordination  of care about HTN and HTN, discussion about risk benefit of anticoagulation and answering questions.  -Follow up with me only as needed for new symptoms or additional concerns. Patient will follow up in June with Dr. Rexene Alberts regarding tremors.    Venancio Poisson, AGNP-BC  [facility] [address] [place], [place] [address]  Phone [phone] Fax [phone]

## 2017-07-19 NOTE — Patient Instructions (Signed)
Continue clopidogrel 75 mg daily  and lipitor  for secondary stroke prevention  Continue to follow with your PCP regarding cholesterol and blood pressure control  monitor blood pressure at home and write down numbers  Maintain strict control of hypertension with blood pressure goal below 130/90, diabetes with hemoglobin A1c goal below 6.5% and cholesterol with LDL cholesterol (bad cholesterol) goal below 70 mg/dL. I also advised the patient to eat a healthy diet with plenty of whole grains, cereals, fruits and vegetables, exercise regularly and maintain ideal body weight.  Follow up with me for stroke only as needed or with new concerns or questions Follow up with Dr. Rexene Alberts in June regarding tremors

## 2017-08-19 ENCOUNTER — Other Ambulatory Visit: Payer: Self-pay | Admitting: Adult Health

## 2017-08-19 NOTE — Telephone Encounter (Signed)
Sent to the pharmacy by e-scribe for 90 days.  Pt has upcoming cpx on 11/16/17.

## 2017-08-23 NOTE — Progress Notes (Signed)
Personally  participated in, made any corrections needed, and agree with history, physical, neuro exam,assessment and plan as stated above.    Byrl Latin, MD [facility] 

## 2017-08-29 NOTE — Progress Notes (Signed)
HPI: FU coronary artery disease, ischemic cardiomyopathy, and hyperlipidemia. He had a myocardial infarction in December 5784 complicated by ventricular fibrillation arrest. He had PCI of his LAD at that time. His most recent Myoview in March of 2013 showed an ejection fraction of 30%. There was a prior anterior and apical infarct, but there was no ischemia. He has declined ICD previously. Previous carotid Dopplers in July of 2006 showed 0-39% bilateral stenosis. Abdominal ultrasound at that time showed no aneurysm.  Last echocardiogram January 2019 showed severely reduced LV function, grade 1 diastolic dysfunction and mild left atrial enlargement.  Since I last saw him,  patient denies dyspnea, chest pain, palpitations or syncope.  Current Outpatient Medications  Medication Sig Dispense Refill  . acidophilus (RISAQUAD) CAPS capsule Take 1 capsule by mouth daily.    . Alpha-D-Galactosidase (BEANO) TABS Take 3 tablets by mouth 2 (two) times daily as needed (gas).    Marland Kitchen atorvastatin (LIPITOR) 80 MG tablet TAKE 1 TABLET BY MOUTH EVERY DAY 90 tablet 1  . carvedilol (COREG) 12.5 MG tablet TAKE 1 TABLET (12.5 MG TOTAL) BY MOUTH 2 (TWO) TIMES DAILY WITH A MEAL. 180 tablet 0  . clopidogrel (PLAVIX) 75 MG tablet Take 1 tablet (75 mg total) by mouth daily. 90 tablet 3  . lisinopril (PRINIVIL,ZESTRIL) 2.5 MG tablet TAKE 1 TABLET TWICE A DAY AT 10 AM AND 5 PM 180 tablet 3  . nitroGLYCERIN (NITROSTAT) 0.4 MG SL tablet Place 1 tablet (0.4 mg total) under the tongue every 5 (five) minutes as needed. 25 tablet 1  . ranitidine (ZANTAC) 150 MG capsule TAKE ONE CAPSULE BY MOUTH EVERY EVENING 100 capsule 3   No current facility-administered medications for this visit.      Past Medical History:  Diagnosis Date  . Allergy   . BPH (benign prostatic hyperplasia)   . CAD (coronary artery disease)   . Cardiomyopathy   . GERD (gastroesophageal reflux disease)   . HYPERLIPIDEMIA   . Hypertension   .  Myocardial infarction ([place]) 2004  . RESPIRATORY FAILURE   . Skin cancer    unknown if BCC or SCC  . Tremor   . UTI     Past Surgical History:  Procedure Laterality Date  . CARDIAC CATHETERIZATION  04-2003   stents  . CATARACT EXTRACTION Bilateral   . TONSILLECTOMY      Social History   Socioeconomic History  . Marital status: Married    Spouse name: Not on file  . Number of children: Not on file  . Years of education: Not on file  . Highest education level: Not on file  Occupational History  . Not on file  Social Needs  . Financial resource strain: Not on file  . Food insecurity:    Worry: Not on file    Inability: Not on file  . Transportation needs:    Medical: Not on file    Non-medical: Not on file  Tobacco Use  . Smoking status: Former Smoker    Packs/day: 1.00    Years: 30.00    Pack years: 30.00    Types: Cigarettes    Last attempt to quit: 07/27/1980    Years since quitting: 37.1  . Smokeless tobacco: Never Used  Substance and Sexual Activity  . Alcohol use: No    Alcohol/week: 0.0 oz  . Drug use: No  . Sexual activity: Never    Birth control/protection: None  Lifestyle  . Physical activity:  Days per week: Not on file    Minutes per session: Not on file  . Stress: Not on file  Relationships  . Social connections:    Talks on phone: Not on file    Gets together: Not on file    Attends religious service: Not on file    Active member of club or organization: Not on file    Attends meetings of clubs or organizations: Not on file    Relationship status: Not on file  . Intimate partner violence:    Fear of current or ex partner: Not on file    Emotionally abused: Not on file    Physically abused: Not on file    Forced sexual activity: Not on file  Other Topics Concern  . Not on file  Social History Narrative   Retired from working in Office manager   Married for 58 years    He likes to walk and read       Family History  Problem  Relation Age of Onset  . Coronary artery disease Father   . Hypertension Father   . Heart attack Father   . Skin cancer Father     ROS: Fatigue and tremor but no fevers or chills, productive cough, hemoptysis, dysphasia, odynophagia, melena, hematochezia, dysuria, hematuria, rash, seizure activity, orthopnea, PND, pedal edema, claudication. Remaining systems are negative.  Physical Exam: Well-developed well-nourished in no acute distress.  Skin is warm and dry.  HEENT is normal.  Neck is supple.  Chest is clear to auscultation with normal expansion.  Cardiovascular exam is regular rate and rhythm.  Abdominal exam nontender or distended. No masses palpated. Extremities show no edema. neuro resting tremor   A/P  1 coronary artery disease-patient without chest pain.  Continue medical therapy including plavix and statin.  2 ischemic cardiomyopathy-we will continue with present medications including ACE inhibitor and beta-blocker.  He has had difficulties with worsening dizziness when we try to advance medications in the past.  He has declined ICD previously and understands risk of sudden death.  3 hypertension-blood pressure is controlled.  Continue present medications.  4 hyperlipidemia-continue statin.  Lipids and liver monitored by primary care.  5 prior TIA-continue Plavix.  Kirk Ruths, MD

## 2017-09-05 ENCOUNTER — Encounter: Payer: Self-pay | Admitting: Cardiology

## 2017-09-05 ENCOUNTER — Ambulatory Visit (INDEPENDENT_AMBULATORY_CARE_PROVIDER_SITE_OTHER): Payer: Medicare Other | Admitting: Cardiology

## 2017-09-05 VITALS — BP 114/66 | HR 65 | Ht 67.5 in | Wt 145.0 lb

## 2017-09-05 DIAGNOSIS — E7800 Pure hypercholesterolemia, unspecified: Secondary | ICD-10-CM | POA: Diagnosis not present

## 2017-09-05 DIAGNOSIS — I2510 Atherosclerotic heart disease of native coronary artery without angina pectoris: Secondary | ICD-10-CM

## 2017-09-05 DIAGNOSIS — I10 Essential (primary) hypertension: Secondary | ICD-10-CM | POA: Diagnosis not present

## 2017-09-05 NOTE — Patient Instructions (Signed)
Your physician wants you to follow-up in: ONE YEAR WITH DR CRENSHAW You will receive a reminder letter in the mail two months in advance. If you don't receive a letter, please call our office to schedule the follow-up appointment.   If you need a refill on your cardiac medications before your next appointment, please call your pharmacy.  

## 2017-09-12 DIAGNOSIS — L570 Actinic keratosis: Secondary | ICD-10-CM | POA: Diagnosis not present

## 2017-10-17 ENCOUNTER — Ambulatory Visit (INDEPENDENT_AMBULATORY_CARE_PROVIDER_SITE_OTHER): Payer: Medicare Other | Admitting: Neurology

## 2017-10-17 ENCOUNTER — Encounter: Payer: Self-pay | Admitting: Neurology

## 2017-10-17 VITALS — BP 126/78 | HR 64 | Ht 67.5 in | Wt 144.0 lb

## 2017-10-17 DIAGNOSIS — G250 Essential tremor: Secondary | ICD-10-CM

## 2017-10-17 DIAGNOSIS — I2510 Atherosclerotic heart disease of native coronary artery without angina pectoris: Secondary | ICD-10-CM | POA: Diagnosis not present

## 2017-10-17 NOTE — Progress Notes (Signed)
Subjective:    Patient ID: [name] [name] is a 84 y.o. male.  HPI     Interim history:   [name] [name] is an 84 year old right-handed gentleman with an underlying medical history of coronary artery disease with status post MI, cardiomyopathy, hyperlipidemia, reflux disease, allergies, BPH, and history of skin cancer, who presents for follow-up consultation of his essential tremor. The patient is accompanied by his wife today. I last saw him on 04/14/2017, at which time he reported no significant improvement with Mysoline. He had side effects with it including grogginess and balance problems, daytime somnolence. I suggested he taper off of it. We talked about a second opinion and potentially getting a consultation for DBS for essential tremor. They declined a referral for DBS evaluation or a second opinion at the time.  Today, 10/17/2017: He reports that the tremor has become worse. He had SE with the mysoline, including personality changes on the mysoline. He has not fallen recently, thankfully. He has tried essential oils, no clear benefit. His wife is asking about CBD oil and is advised that there is no FDA indication for this, that there is not enough data to support use of CBD oil for tremor control.  He was admitted in the interim to the hospital for TIA. He was in the hospital from 06/02/2017 through 06/03/2017 and had a TIA workup. Workup was negative for acute stroke. He had outpatient follow-up in stroke clinic in March 2019.   The patient's allergies, current medications, family history, past medical history, past social history, past surgical history and problem list were reviewed and updated as appropriate.    Previously:  I first met him on 01/12/2017 at the request of his primary care provider, at which time the patient reported an approximately five-year history of bilateral upper extremity tremors. He had some side effects on Mysoline, particularly sleepiness. I suggested  restarting it on a lower dose. He had some sleep difficulties and I suggested he try melatonin.    01/12/2017: (He) has had a upper extremity tremor for years, at least 5 years. I reviewed your office note from 11/11/2016. He had side effects on Mysoline 250 mg strength, including sleepiness reported, and daughter reports that he had longer reaction time while driving. He was first started on Mysoline 50 mg strength in May 2017 and then about a month later he was put on the 250 mg strength until about October 2017. He believes that the Mysoline had helped at least in the beginning. His wife noted that he slept a little better when he first started the Mysoline but this effect abated after a while. He has a longer standing history of difficulty maintaining sleep secondary to a regular sleep schedule when he was still working. 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 He is currently on carvedilol per cardiology. He had a remote head CT without contrast on 05/19/2003 which I reviewed: IMPRESSION  Mild atrophy and chronic small vessel disease. No acute intracranial pathology evident.   Opacification of the right maxillary sinus and some fluid in the sphenoid sinus.     His tremor is more right sided. He has a family history of tremors in his paternal grandfather, father, 2 nephews (sister's children). He lives with his wife. He has 2 daughters, neither one with tremors at this time. He quit smoking over 20 years ago and does not drink alcohol on a regular basis, caffeine in the form of coffee one cup per day and a diet soda occasionally. He  does not drink water on a regular basis, maybe 1 or 2 cups per day. He has not noticed any problems with his balance, he has not fallen.   His Past Medical History Is Significant For: Past Medical History:  Diagnosis Date  . Allergy   . BPH (benign prostatic hyperplasia)   . CAD (coronary artery disease)   . Cardiomyopathy   . GERD (gastroesophageal reflux disease)   .  HYPERLIPIDEMIA   . Hypertension   . Myocardial infarction ([place]) 2004  . RESPIRATORY FAILURE   . Skin cancer    unknown if BCC or SCC  . Tremor   . UTI     His Past Surgical History Is Significant For: Past Surgical History:  Procedure Laterality Date  . CARDIAC CATHETERIZATION  04-2003   stents  . CATARACT EXTRACTION Bilateral   . TONSILLECTOMY      His Family History Is Significant For: Family History  Problem Relation Age of Onset  . Coronary artery disease Father   . Hypertension Father   . Heart attack Father   . Skin cancer Father     His Social History Is Significant For: Social History   Socioeconomic History  . Marital status: Married    Spouse name: Not on file  . Number of children: Not on file  . Years of education: Not on file  . Highest education level: Not on file  Occupational History  . Not on file  Social Needs  . Financial resource strain: Not on file  . Food insecurity:    Worry: Not on file    Inability: Not on file  . Transportation needs:    Medical: Not on file    Non-medical: Not on file  Tobacco Use  . Smoking status: Former Smoker    Packs/day: 1.00    Years: 30.00    Pack years: 30.00    Types: Cigarettes    Last attempt to quit: 07/27/1980    Years since quitting: 37.2  . Smokeless tobacco: Never Used  Substance and Sexual Activity  . Alcohol use: No    Alcohol/week: 0.0 oz  . Drug use: No  . Sexual activity: Never    Birth control/protection: None  Lifestyle  . Physical activity:    Days per week: Not on file    Minutes per session: Not on file  . Stress: Not on file  Relationships  . Social connections:    Talks on phone: Not on file    Gets together: Not on file    Attends religious service: Not on file    Active member of club or organization: Not on file    Attends meetings of clubs or organizations: Not on file    Relationship status: Not on file  Other Topics Concern  . Not on file  Social History Narrative    Retired from working in Office manager   Married for 58 years    He likes to walk and read       His Allergies Are:  Allergies  Allergen Reactions  . Sulfonamide Derivatives     REACTION: history  :   His Current Medications Are:  Outpatient Encounter Medications as of 10/17/2017  Medication Sig  . acidophilus (RISAQUAD) CAPS capsule Take 1 capsule by mouth daily.  . Alpha-D-Galactosidase (BEANO) TABS Take 3 tablets by mouth 2 (two) times daily as needed (gas).  Marland Kitchen atorvastatin (LIPITOR) 80 MG tablet TAKE 1 TABLET BY MOUTH EVERY DAY  . carvedilol (COREG)  12.5 MG tablet TAKE 1 TABLET (12.5 MG TOTAL) BY MOUTH 2 (TWO) TIMES DAILY WITH A MEAL.  Marland Kitchen clopidogrel (PLAVIX) 75 MG tablet Take 1 tablet (75 mg total) by mouth daily.  Marland Kitchen lisinopril (PRINIVIL,ZESTRIL) 2.5 MG tablet TAKE 1 TABLET TWICE A DAY AT 10 AM AND 5 PM  . nitroGLYCERIN (NITROSTAT) 0.4 MG SL tablet Place 1 tablet (0.4 mg total) under the tongue every 5 (five) minutes as needed.  . ranitidine (ZANTAC) 150 MG capsule TAKE ONE CAPSULE BY MOUTH EVERY EVENING   No facility-administered encounter medications on file as of 10/17/2017.   :  Review of Systems:  Out of a complete 14 point review of systems, all are reviewed and negative with the exception of these symptoms as listed below: Review of Systems  Neurological:       Pt presents today to discuss his tremor. Pt feels that his tremor is worsening.    Objective:  Neurological Exam  Physical Exam Physical Examination:   Vitals:   10/17/17 1408  BP: 126/78  Pulse: 64    General Examination: The patient is a very pleasant 84 y.o. male in no acute distress. He appears well-developed and well-nourished and well groomed.   HEENT:Normocephalic, atraumatic, pupils are equal, round and reactive to light and accommodation. Extraocular tracking is good without limitation to gaze excursion or nystagmus noted. Normal smooth pursuit is noted. Hearing is grossly intact. Face  is symmetric with normal facial animation and normal facial sensation. Speech is clear with no dysarthria noted. There is no hypophonia. There is no lip, or voice tremor, has a slight, intermittent head tremor. Neck is supple with full range of passive and active motion. Oropharynx exam reveals:moderatemouth dryness, adequatedental hygiene. Tongue protrudes centrally and palate elevates symmetrically.   Chest:Clear to auscultation without wheezing, rhonchi or crackles noted.  Heart:S1+S2+0, regular and normal without murmurs, rubs or gallops noted.   Abdomen:Soft, non-tender and non-distended with normal bowel sounds appreciated on auscultation.  Extremities:There isnopitting edema in the distal lower extremities bilaterally.   Skin: Warm and dry without trophic changes noted.  Musculoskeletal: exam reveals no obvious joint deformities, tenderness or joint swelling or erythema.   Neurologically:  Mental status: The patient is awake, alert and oriented in all 4 spheres.Hisimmediate and remote memory, attention, language skills and fund of knowledge are appropriate. There is no evidence of aphasia, agnosia, apraxia or anomia. Speech is clear with normal prosody and enunciation. Thought process is linear. Mood is normaland affect is normal.  Cranial nerves II - XII are as described above under HEENT exam.  Motor exam: Normal bulk, strength for age and tone is noted. There is no drift, or rebound. Romberg isnot tested for safety. He is able to stand narrow based.   (On9/09/2016: On Archimedes spiral drawing there ismoderate tremulousness noted bilaterally. Handwriting is moderately tremulous, not micrographic and legible but difficult to read.)  There isa mild intermittent resting tremor in the right more than left upper extremity. He has a bilateral upper extremity postural and action tremor, right more than left, mild to moderate.  Reflexes are1+ throughout. Fine motor  skills and coordination:mildly impaired globally.   Cerebellar testing: No dysmetria or intention tremor on finger to nose testing. There is no truncal or gait ataxia.  Sensory exam: intact to light touch proprioception in the upper and lower extremities.  Gait, station and balance:Hestands with mild difficulty. His posture is fairly age-appropriate to mildly stooped. He walks somewhat cautiously. No problems turning.  Assessmentand Plan:   In summary,[name] H Nicholsis a very pleasant 72 year oldmalewith an underlying medical history of coronary artery disease with status post MI, cardiomyopathy, hyperlipidemia, reflux disease, allergies, BPH, and history of skin cancer, whopresents for follow up consultation of his essential tremor of many years' duration. He has a family history of tremors as well. He had side effects with higher dose of Mysoline and we retried it at a lower dose, of 100 mg at night and he still had side effects including drowsiness, balance problems, and per wife also some personality changes (she and her daughter noticed this, he did not). he was advised to taper off of it when he was seen in December 2018. I again discussed with the patient and his family his symptoms and potential symptomatic treatment options. Unfortunately, there is not a whole lot we can do at this time from my end of things. He is encouraged to consider seeing someone at the movement disorders center such as [facility], [place] or [place]. We talked about potential tremor triggers of the importance of keeping a healthy lifestyle. I talked to the patient about fall risk gait safety. We mutually agreed for an as needed follow-up this point. I answered all their questions today and the patient and his wife were in agreement.

## 2017-10-17 NOTE — Patient Instructions (Addendum)
  Please remember, that any kind of tremor may be exacerbated by anxiety, anger, nervousness, excitement, dehydration, sleep deprivation, by caffeine, and low blood sugar values or blood sugar fluctuations. Some medications, especially some antidepressants and lithium can cause or exacerbate tremors.   We will play it by ear at this time.   Recent stroke work up was complete and benign.   Please discuss with primary care the referral to an academic center for evaluation and treatment for tremor.

## 2017-10-21 ENCOUNTER — Ambulatory Visit: Payer: Medicare Other

## 2017-11-13 ENCOUNTER — Other Ambulatory Visit: Payer: Self-pay | Admitting: Adult Health

## 2017-11-15 NOTE — Telephone Encounter (Signed)
Appt on 11/16/17

## 2017-11-16 ENCOUNTER — Ambulatory Visit (INDEPENDENT_AMBULATORY_CARE_PROVIDER_SITE_OTHER): Payer: Medicare Other | Admitting: Adult Health

## 2017-11-16 ENCOUNTER — Encounter: Payer: Self-pay | Admitting: Adult Health

## 2017-11-16 VITALS — BP 118/66 | Temp 97.9°F | Ht 67.75 in | Wt 144.0 lb

## 2017-11-16 DIAGNOSIS — I10 Essential (primary) hypertension: Secondary | ICD-10-CM

## 2017-11-16 DIAGNOSIS — K219 Gastro-esophageal reflux disease without esophagitis: Secondary | ICD-10-CM

## 2017-11-16 DIAGNOSIS — E7800 Pure hypercholesterolemia, unspecified: Secondary | ICD-10-CM

## 2017-11-16 DIAGNOSIS — I429 Cardiomyopathy, unspecified: Secondary | ICD-10-CM

## 2017-11-16 DIAGNOSIS — I2510 Atherosclerotic heart disease of native coronary artery without angina pectoris: Secondary | ICD-10-CM

## 2017-11-16 LAB — CBC WITH DIFFERENTIAL/PLATELET
BASOS PCT: 0.5 % (ref 0.0–3.0)
Basophils Absolute: 0 10*3/uL (ref 0.0–0.1)
EOS PCT: 1.9 % (ref 0.0–5.0)
Eosinophils Absolute: 0.1 10*3/uL (ref 0.0–0.7)
HEMATOCRIT: 45.8 % (ref 39.0–52.0)
Hemoglobin: 15.2 g/dL (ref 13.0–17.0)
LYMPHS PCT: 28.3 % (ref 12.0–46.0)
Lymphs Abs: 1.7 10*3/uL (ref 0.7–4.0)
MCHC: 33.2 g/dL (ref 30.0–36.0)
MCV: 93.8 fl (ref 78.0–100.0)
MONOS PCT: 8.1 % (ref 3.0–12.0)
Monocytes Absolute: 0.5 10*3/uL (ref 0.1–1.0)
NEUTROS ABS: 3.7 10*3/uL (ref 1.4–7.7)
Neutrophils Relative %: 61.2 % (ref 43.0–77.0)
PLATELETS: 171 10*3/uL (ref 150.0–400.0)
RBC: 4.89 Mil/uL (ref 4.22–5.81)
RDW: 14.4 % (ref 11.5–15.5)
WBC: 6 10*3/uL (ref 4.0–10.5)

## 2017-11-16 LAB — LIPID PANEL
CHOLESTEROL: 111 mg/dL (ref 0–200)
HDL: 53.4 mg/dL (ref >39.00)
LDL Cholesterol: 45 mg/dL (ref 0–99)
NONHDL: 57.46
TRIGLYCERIDES: 62 mg/dL (ref 0.0–149.0)
Total CHOL/HDL Ratio: 2
VLDL: 12.4 mg/dL (ref 0.0–40.0)

## 2017-11-16 LAB — BASIC METABOLIC PANEL
BUN: 20 mg/dL (ref 6–23)
CHLORIDE: 103 meq/L (ref 96–112)
CO2: 31 meq/L (ref 19–32)
Calcium: 9.5 mg/dL (ref 8.4–10.5)
Creatinine, Ser: 1.32 mg/dL (ref 0.40–1.50)
GFR: 54.97 mL/min — ABNORMAL LOW (ref >60.00)
GLUCOSE: 90 mg/dL (ref 70–99)
Potassium: 4.7 mEq/L (ref 3.5–5.1)
SODIUM: 139 meq/L (ref 135–145)

## 2017-11-16 LAB — POCT URINALYSIS DIPSTICK
Blood, UA: NEGATIVE
Glucose, UA: NEGATIVE
Ketones, UA: NEGATIVE
LEUKOCYTES UA: NEGATIVE
NITRITE UA: NEGATIVE
Odor: NEGATIVE
PROTEIN UA: NEGATIVE
SPEC GRAV UA: 1.015 (ref 1.010–1.025)
Urobilinogen, UA: 0.2 E.U./dL
pH, UA: 7 (ref 5.0–8.0)

## 2017-11-16 LAB — HEPATIC FUNCTION PANEL
ALBUMIN: 4.1 g/dL (ref 3.5–5.2)
ALK PHOS: 79 U/L (ref 39–117)
ALT: 15 U/L (ref 0–53)
AST: 17 U/L (ref 0–37)
Bilirubin, Direct: 0.2 mg/dL (ref 0.0–0.3)
TOTAL PROTEIN: 6.9 g/dL (ref 6.0–8.3)
Total Bilirubin: 0.8 mg/dL (ref 0.2–1.2)

## 2017-11-16 NOTE — Progress Notes (Signed)
Subjective:    Patient ID: [name] [name], male    DOB: 11/14/1931, 84 y.o.   MRN: [id number]  HPI  Patient presents for yearly follow up exam. He is a pleasant 84 year old male who  has a past medical history of Allergy, BPH (benign prostatic hyperplasia), CAD (coronary artery disease), Cardiomyopathy, GERD (gastroesophageal reflux disease), HYPERLIPIDEMIA, Hypertension, Myocardial infarction ([place]) (2004), RESPIRATORY FAILURE, Skin cancer, Tremor, and UTI.   CAD -denies chest pain.  Current medical therapy includes Plavix and a statin-followed by cardiology  Ischemic cardiomyopathy-he prescribed ace inhibitor and beta-blocker-is followed by cardiology  Essential Hypertension -BP controlled with beta-blocker and ACE inhibitor BP Readings from Last 3 Encounters:  11/16/17 118/66  10/17/17 126/78  09/05/17 114/66   Hyperlipidemia - Currently controlled on statin  Lab Results  Component Value Date   CHOL 108 06/03/2017   HDL 42 06/03/2017   LDLCALC 55 06/03/2017   TRIG 57 06/03/2017   CHOLHDL 2.6 06/03/2017   History of Prior TIA -prescribed Plavix   Essential Tremor - Followed by Neurology PRN - not currently on treatment due to side effects of past medication. Reports that he has started trialing CBD oil but has not noticed any improvement   All immunizations and health maintenance protocols were reviewed with the patient and needed orders were placed. He is UTD on vaccinations   Appropriate screening laboratory values were ordered for the patient including screening of hyperlipidemia, renal function and hepatic function. If indicated by BPH, a PSA was ordered.  Medication reconciliation,  past medical history, social history, problem list and allergies were reviewed in detail with the patient  Goals were established with regard to weight loss, exercise, and  diet in compliance with medications  End of life planning was discussed. He has an advanced directive and living  will.  He has no acute complaints  Review of Systems  Constitutional: Negative.   HENT: Negative.   Eyes: Negative.   Respiratory: Negative.   Cardiovascular: Negative.   Gastrointestinal: Negative.   Endocrine: Negative.   Genitourinary: Negative.   Musculoskeletal: Negative.   Skin: Negative.   Allergic/Immunologic: Negative.   Neurological: Positive for tremors and weakness.  Hematological: Negative.   Psychiatric/Behavioral: Negative.   All other systems reviewed and are negative.  Past Medical History:  Diagnosis Date  . Allergy   . BPH (benign prostatic hyperplasia)   . CAD (coronary artery disease)   . Cardiomyopathy   . GERD (gastroesophageal reflux disease)   . HYPERLIPIDEMIA   . Hypertension   . Myocardial infarction ([place]) 2004  . RESPIRATORY FAILURE   . Skin cancer    unknown if BCC or SCC  . Tremor   . UTI     Social History   Socioeconomic History  . Marital status: Married    Spouse name: Not on file  . Number of children: Not on file  . Years of education: Not on file  . Highest education level: Not on file  Occupational History  . Not on file  Social Needs  . Financial resource strain: Not on file  . Food insecurity:    Worry: Not on file    Inability: Not on file  . Transportation needs:    Medical: Not on file    Non-medical: Not on file  Tobacco Use  . Smoking status: Former Smoker    Packs/day: 1.00    Years: 30.00    Pack years: 30.00    Types: Cigarettes  Last attempt to quit: 07/27/1980    Years since quitting: 37.3  . Smokeless tobacco: Never Used  Substance and Sexual Activity  . Alcohol use: No    Alcohol/week: 0.0 oz  . Drug use: No  . Sexual activity: Never    Birth control/protection: None  Lifestyle  . Physical activity:    Days per week: Not on file    Minutes per session: Not on file  . Stress: Not on file  Relationships  . Social connections:    Talks on phone: Not on file    Gets together: Not on file     Attends religious service: Not on file    Active member of club or organization: Not on file    Attends meetings of clubs or organizations: Not on file    Relationship status: Not on file  . Intimate partner violence:    Fear of current or ex partner: Not on file    Emotionally abused: Not on file    Physically abused: Not on file    Forced sexual activity: Not on file  Other Topics Concern  . Not on file  Social History Narrative   Retired from working in Office manager   Married for 58 years    He likes to walk and read       Past Surgical History:  Procedure Laterality Date  . CARDIAC CATHETERIZATION  04-2003   stents  . CATARACT EXTRACTION Bilateral   . TONSILLECTOMY      Family History  Problem Relation Age of Onset  . Coronary artery disease Father   . Hypertension Father   . Heart attack Father   . Skin cancer Father     Allergies  Allergen Reactions  . Sulfonamide Derivatives     REACTION: history    Current Outpatient Medications on File Prior to Visit  Medication Sig Dispense Refill  . Alpha-D-Galactosidase (BEANO) TABS Take 3 tablets by mouth 2 (two) times daily as needed (gas).    . clopidogrel (PLAVIX) 75 MG tablet Take 1 tablet (75 mg total) by mouth daily. 90 tablet 3  . lisinopril (PRINIVIL,ZESTRIL) 2.5 MG tablet TAKE 1 TABLET TWICE A DAY AT 10 AM AND 5 PM 180 tablet 3  . nitroGLYCERIN (NITROSTAT) 0.4 MG SL tablet Place 1 tablet (0.4 mg total) under the tongue every 5 (five) minutes as needed. 25 tablet 1  . NON FORMULARY HEMP OIL    . Probiotic Product (PROBIOTIC PO) Take by mouth.    . ranitidine (ZANTAC) 150 MG capsule TAKE ONE CAPSULE BY MOUTH EVERY EVENING 100 capsule 3  . atorvastatin (LIPITOR) 80 MG tablet TAKE 1 TABLET BY MOUTH EVERY DAY 90 tablet 3  . carvedilol (COREG) 12.5 MG tablet TAKE 1 TABLET (12.5 MG TOTAL) BY MOUTH 2 (TWO) TIMES DAILY WITH A MEAL. 180 tablet 3   No current facility-administered medications on file prior to  visit.     BP 118/66   Temp 97.9 F (36.6 C) (Oral)   Ht 5' 7.75" (1.721 m)   Wt 144 lb (65.3 kg)   BMI 22.06 kg/m      Objective:   Physical Exam  Constitutional: He is oriented to person, place, and time. He appears well-developed and well-nourished. No distress.  HENT:  Head: Normocephalic and atraumatic.  Right Ear: External ear normal.  Left Ear: External ear normal.  Nose: Nose normal.  Mouth/Throat: Oropharynx is clear and moist. No oropharyngeal exudate.  Eyes: Pupils are equal, round, and  reactive to light. Conjunctivae and EOM are normal. Right eye exhibits no discharge. Left eye exhibits no discharge. No scleral icterus.  Neck: Normal range of motion. Neck supple. No JVD present. No tracheal deviation present. No thyromegaly present.  Cardiovascular: Normal rate, regular rhythm, normal heart sounds and intact distal pulses. Exam reveals no gallop and no friction rub.  No murmur heard. Pulmonary/Chest: Effort normal and breath sounds normal. No stridor. No respiratory distress. He has no wheezes. He has no rales. He exhibits no tenderness.  Abdominal: Soft. Bowel sounds are normal. He exhibits no distension and no mass. There is no tenderness. There is no rebound and no guarding. No hernia.  Genitourinary:  Genitourinary Comments: Deferred  Musculoskeletal: Normal range of motion. He exhibits no edema, tenderness or deformity.  Lymphadenopathy:    He has no cervical adenopathy.  Neurological: He is alert and oriented to person, place, and time. He displays tremor (resting tremor ). He displays normal reflexes. No cranial nerve deficit or sensory deficit. He exhibits normal muscle tone. Coordination normal.  Skin: Skin is warm and dry. Capillary refill takes 2 to 3 seconds. He is not diaphoretic.  Psychiatric: He has a normal mood and affect. His behavior is normal. Judgment and thought content normal.  Nursing note and vitals reviewed.     Assessment & Plan:  1.  Essential hypertension - Well controlled. No change in medications  - Basic metabolic panel - CBC with Differential/Platelet - Hepatic function panel - Lipid panel - POC Urinalysis Dipstick  2. Atherosclerosis of native coronary artery of native heart without angina pectoris - Continue with current regimen  - Basic metabolic panel - CBC with Differential/Platelet - Hepatic function panel - Lipid panel  3. Cardiomyopathy, unspecified type ([place]) - Continue with current regimen  - Basic metabolic panel - CBC with Differential/Platelet - Hepatic function panel - Lipid panel  4. Gastroesophageal reflux disease without esophagitis - Continue with Zantac   5. Pure hypercholesterolemia - Well controlled in the past. Doubt change in medication regimen  - Basic metabolic panel - CBC with Differential/Platelet - Hepatic function panel - Lipid panel  Dorothyann Peng, NP

## 2017-11-16 NOTE — Telephone Encounter (Signed)
Sent to the pharmacy by e-scribe. 

## 2017-11-25 ENCOUNTER — Other Ambulatory Visit: Payer: Self-pay | Admitting: Adult Health

## 2017-11-25 NOTE — Telephone Encounter (Signed)
Sent to the pharmacy by e-scribe. 

## 2018-01-30 DIAGNOSIS — H02102 Unspecified ectropion of right lower eyelid: Secondary | ICD-10-CM | POA: Diagnosis not present

## 2018-01-30 DIAGNOSIS — H26493 Other secondary cataract, bilateral: Secondary | ICD-10-CM | POA: Diagnosis not present

## 2018-01-30 DIAGNOSIS — H524 Presbyopia: Secondary | ICD-10-CM | POA: Diagnosis not present

## 2018-01-30 DIAGNOSIS — Z961 Presence of intraocular lens: Secondary | ICD-10-CM | POA: Diagnosis not present

## 2018-02-09 ENCOUNTER — Ambulatory Visit (INDEPENDENT_AMBULATORY_CARE_PROVIDER_SITE_OTHER): Payer: Medicare Other | Admitting: *Deleted

## 2018-02-09 DIAGNOSIS — Z23 Encounter for immunization: Secondary | ICD-10-CM

## 2018-03-15 DIAGNOSIS — D485 Neoplasm of uncertain behavior of skin: Secondary | ICD-10-CM | POA: Diagnosis not present

## 2018-03-15 DIAGNOSIS — D044 Carcinoma in situ of skin of scalp and neck: Secondary | ICD-10-CM | POA: Diagnosis not present

## 2018-03-15 DIAGNOSIS — L570 Actinic keratosis: Secondary | ICD-10-CM | POA: Diagnosis not present

## 2018-03-15 DIAGNOSIS — L905 Scar conditions and fibrosis of skin: Secondary | ICD-10-CM | POA: Diagnosis not present

## 2018-03-15 DIAGNOSIS — Z85828 Personal history of other malignant neoplasm of skin: Secondary | ICD-10-CM | POA: Diagnosis not present

## 2018-03-24 ENCOUNTER — Emergency Department (HOSPITAL_COMMUNITY)
Admission: EM | Admit: 2018-03-24 | Discharge: 2018-04-09 | Disposition: E | Payer: Medicare Other | Attending: Emergency Medicine | Admitting: Emergency Medicine

## 2018-03-24 ENCOUNTER — Emergency Department (HOSPITAL_COMMUNITY): Payer: Medicare Other

## 2018-03-24 ENCOUNTER — Encounter (HOSPITAL_COMMUNITY): Payer: Self-pay | Admitting: Emergency Medicine

## 2018-03-24 DIAGNOSIS — I255 Ischemic cardiomyopathy: Secondary | ICD-10-CM | POA: Diagnosis not present

## 2018-03-24 DIAGNOSIS — Z79899 Other long term (current) drug therapy: Secondary | ICD-10-CM | POA: Diagnosis not present

## 2018-03-24 DIAGNOSIS — R231 Pallor: Secondary | ICD-10-CM | POA: Diagnosis not present

## 2018-03-24 DIAGNOSIS — R570 Cardiogenic shock: Secondary | ICD-10-CM | POA: Diagnosis not present

## 2018-03-24 DIAGNOSIS — E872 Acidosis: Secondary | ICD-10-CM | POA: Diagnosis not present

## 2018-03-24 DIAGNOSIS — R069 Unspecified abnormalities of breathing: Secondary | ICD-10-CM | POA: Diagnosis not present

## 2018-03-24 DIAGNOSIS — Z85828 Personal history of other malignant neoplasm of skin: Secondary | ICD-10-CM | POA: Insufficient documentation

## 2018-03-24 DIAGNOSIS — J9601 Acute respiratory failure with hypoxia: Secondary | ICD-10-CM

## 2018-03-24 DIAGNOSIS — I469 Cardiac arrest, cause unspecified: Secondary | ICD-10-CM | POA: Diagnosis not present

## 2018-03-24 DIAGNOSIS — I129 Hypertensive chronic kidney disease with stage 1 through stage 4 chronic kidney disease, or unspecified chronic kidney disease: Secondary | ICD-10-CM | POA: Insufficient documentation

## 2018-03-24 DIAGNOSIS — N183 Chronic kidney disease, stage 3 (moderate): Secondary | ICD-10-CM | POA: Insufficient documentation

## 2018-03-24 DIAGNOSIS — Z515 Encounter for palliative care: Secondary | ICD-10-CM

## 2018-03-24 DIAGNOSIS — Z87891 Personal history of nicotine dependence: Secondary | ICD-10-CM | POA: Insufficient documentation

## 2018-03-24 DIAGNOSIS — I499 Cardiac arrhythmia, unspecified: Secondary | ICD-10-CM | POA: Diagnosis not present

## 2018-03-24 DIAGNOSIS — Z4682 Encounter for fitting and adjustment of non-vascular catheter: Secondary | ICD-10-CM | POA: Diagnosis not present

## 2018-03-24 DIAGNOSIS — I2510 Atherosclerotic heart disease of native coronary artery without angina pectoris: Secondary | ICD-10-CM | POA: Insufficient documentation

## 2018-03-24 DIAGNOSIS — Z7902 Long term (current) use of antithrombotics/antiplatelets: Secondary | ICD-10-CM | POA: Insufficient documentation

## 2018-03-24 DIAGNOSIS — R4020 Unspecified coma: Secondary | ICD-10-CM | POA: Diagnosis not present

## 2018-03-24 DIAGNOSIS — R0689 Other abnormalities of breathing: Secondary | ICD-10-CM | POA: Diagnosis not present

## 2018-03-24 DIAGNOSIS — G931 Anoxic brain damage, not elsewhere classified: Secondary | ICD-10-CM | POA: Diagnosis not present

## 2018-03-24 DIAGNOSIS — I4901 Ventricular fibrillation: Secondary | ICD-10-CM | POA: Diagnosis not present

## 2018-03-24 DIAGNOSIS — R404 Transient alteration of awareness: Secondary | ICD-10-CM | POA: Diagnosis not present

## 2018-03-24 LAB — I-STAT ARTERIAL BLOOD GAS, ED
Acid-base deficit: 15 mmol/L — ABNORMAL HIGH (ref 0.0–2.0)
Bicarbonate: 15.9 mmol/L — ABNORMAL LOW (ref 20.0–28.0)
O2 Saturation: 90 %
PCO2 ART: 41.2 mmHg (ref 32.0–48.0)
PO2 ART: 54 mmHg — AB (ref 83.0–108.0)
Patient temperature: 86.7
TCO2: 18 mmol/L — ABNORMAL LOW (ref 22–32)
pH, Arterial: 7.151 — CL (ref 7.350–7.450)

## 2018-03-24 LAB — COMPREHENSIVE METABOLIC PANEL
ALBUMIN: 3.1 g/dL — AB (ref 3.5–5.0)
ALK PHOS: 92 U/L (ref 38–126)
ALT: 223 U/L — ABNORMAL HIGH (ref 0–44)
AST: 204 U/L — ABNORMAL HIGH (ref 15–41)
Anion gap: 15 (ref 5–15)
BILIRUBIN TOTAL: 1 mg/dL (ref 0.3–1.2)
BUN: 20 mg/dL (ref 8–23)
CALCIUM: 8.2 mg/dL — AB (ref 8.9–10.3)
CO2: 14 mmol/L — ABNORMAL LOW (ref 22–32)
Chloride: 110 mmol/L (ref 98–111)
Creatinine, Ser: 1.96 mg/dL — ABNORMAL HIGH (ref 0.61–1.24)
GFR calc Af Amer: 34 mL/min — ABNORMAL LOW (ref >60)
GFR, EST NON AFRICAN AMERICAN: 30 mL/min — AB (ref >60)
GLUCOSE: 269 mg/dL — AB (ref 70–99)
POTASSIUM: 3.3 mmol/L — AB (ref 3.5–5.1)
SODIUM: 139 mmol/L (ref 135–145)
TOTAL PROTEIN: 5.4 g/dL — AB (ref 6.5–8.1)

## 2018-03-24 LAB — I-STAT VENOUS BLOOD GAS, ED
Acid-base deficit: 13 mmol/L — ABNORMAL HIGH (ref 0.0–2.0)
Bicarbonate: 21.5 mmol/L (ref 20.0–28.0)
O2 Saturation: 29 %
PCO2 VEN: 92.7 mmHg — AB (ref 44.0–60.0)
PH VEN: 6.973 — AB (ref 7.250–7.430)
TCO2: 24 mmol/L (ref 22–32)
pO2, Ven: 30 mmHg — CL (ref 32.0–45.0)

## 2018-03-24 LAB — I-STAT CHEM 8, ED
BUN: 32 mg/dL — AB (ref 8–23)
CREATININE: 1.5 mg/dL — AB (ref 0.61–1.24)
Calcium, Ion: 1.09 mmol/L — ABNORMAL LOW (ref 1.15–1.40)
Chloride: 109 mmol/L (ref 98–111)
GLUCOSE: 310 mg/dL — AB (ref 70–99)
HEMATOCRIT: 46 % (ref 39.0–52.0)
Hemoglobin: 15.6 g/dL (ref 13.0–17.0)
Potassium: 3.7 mmol/L (ref 3.5–5.1)
Sodium: 140 mmol/L (ref 135–145)
TCO2: 18 mmol/L — AB (ref 22–32)

## 2018-03-24 LAB — CBC
HCT: 49.1 % (ref 39.0–52.0)
HEMOGLOBIN: 13.9 g/dL (ref 13.0–17.0)
MCH: 29.8 pg (ref 26.0–34.0)
MCHC: 28.3 g/dL — ABNORMAL LOW (ref 30.0–36.0)
MCV: 105.4 fL — ABNORMAL HIGH (ref 80.0–100.0)
NRBC: 1.3 % — AB (ref 0.0–0.2)
PLATELETS: 192 10*3/uL (ref 150–400)
RBC: 4.66 MIL/uL (ref 4.22–5.81)
RDW: 13.8 % (ref 11.5–15.5)
WBC: 12.2 10*3/uL — ABNORMAL HIGH (ref 4.0–10.5)

## 2018-03-24 LAB — I-STAT CG4 LACTIC ACID, ED
LACTIC ACID, VENOUS: 11.52 mmol/L — AB (ref 0.5–1.9)
Lactic Acid, Venous: 8.73 mmol/L (ref 0.5–1.9)

## 2018-03-24 LAB — I-STAT TROPONIN, ED: Troponin i, poc: 0.47 ng/mL (ref 0.00–0.08)

## 2018-03-24 LAB — MAGNESIUM: MAGNESIUM: 2.6 mg/dL — AB (ref 1.7–2.4)

## 2018-03-24 LAB — PROTIME-INR
INR: 1.45
PROTHROMBIN TIME: 17.5 s — AB (ref 11.4–15.2)

## 2018-03-24 MED ORDER — MORPHINE SULFATE (PF) 4 MG/ML IV SOLN
4.0000 mg | INTRAVENOUS | Status: DC | PRN
  Administered 2018-03-24: 4 mg via INTRAVENOUS

## 2018-03-24 MED ORDER — EPINEPHRINE PF 1 MG/ML IJ SOLN
0.5000 ug/min | INTRAMUSCULAR | Status: DC
  Administered 2018-03-24: 10 ug/min via INTRAVENOUS
  Filled 2018-03-24: qty 4

## 2018-03-24 MED ORDER — DIPHENHYDRAMINE HCL 50 MG/ML IJ SOLN: 25.0000 mg | INTRAMUSCULAR | Status: DC | PRN

## 2018-03-24 MED ORDER — MORPHINE BOLUS VIA INFUSION
5.0000 mg | INTRAVENOUS | Status: DC | PRN
  Filled 2018-03-24: qty 5

## 2018-03-24 MED ORDER — LORAZEPAM 2 MG/ML IJ SOLN
4.0000 mg | Freq: Once | INTRAMUSCULAR | Status: AC
  Administered 2018-03-24: 4 mg via INTRAVENOUS
  Filled 2018-03-24: qty 2

## 2018-03-24 MED ORDER — POLYVINYL ALCOHOL 1.4 % OP SOLN: 1.0000 [drp] | Freq: Four times a day (QID) | OPHTHALMIC | Status: DC | PRN

## 2018-03-24 MED ORDER — SODIUM BICARBONATE 8.4 % IV SOLN
INTRAVENOUS | Status: AC | PRN
  Administered 2018-03-24 (×2): 100 meq via INTRAVENOUS

## 2018-03-24 MED ORDER — LORAZEPAM 2 MG/ML IJ SOLN
INTRAMUSCULAR | Status: AC
  Filled 2018-03-24: qty 1

## 2018-03-24 MED ORDER — MORPHINE SULFATE (PF) 4 MG/ML IV SOLN
INTRAVENOUS | Status: AC
  Administered 2018-03-24: 4 mg
  Filled 2018-03-24: qty 2

## 2018-03-24 MED ORDER — GLYCOPYRROLATE 0.2 MG/ML IJ SOLN
0.2000 mg | INTRAMUSCULAR | Status: DC | PRN
  Filled 2018-03-24: qty 1

## 2018-03-24 MED ORDER — DEXTROSE 5 % IV SOLN: INTRAVENOUS | Status: DC

## 2018-03-24 MED ORDER — GLYCOPYRROLATE 0.2 MG/ML IJ SOLN: 0.2000 mg | INTRAMUSCULAR | Status: DC | PRN

## 2018-03-24 MED ORDER — LORAZEPAM 2 MG/ML IJ SOLN: 4.0000 mg | INTRAMUSCULAR | Status: DC | PRN

## 2018-03-24 MED ORDER — MORPHINE SULFATE (PF) 2 MG/ML IV SOLN: 2.0000 mg | INTRAVENOUS | Status: DC | PRN

## 2018-03-24 MED ORDER — AMIODARONE HCL IN DEXTROSE 360-4.14 MG/200ML-% IV SOLN: 60.0000 mg/h | Freq: Once | INTRAVENOUS | Status: DC

## 2018-03-24 MED ORDER — AMIODARONE HCL IN DEXTROSE 360-4.14 MG/200ML-% IV SOLN: 30.0000 mg/h | INTRAVENOUS | Status: DC

## 2018-03-24 MED ORDER — NOREPINEPHRINE 4 MG/250ML-% IV SOLN
INTRAVENOUS | Status: AC
  Administered 2018-03-24: 10 ug/min
  Filled 2018-03-24: qty 250

## 2018-03-24 MED ORDER — MORPHINE 100MG IN NS 100ML (1MG/ML) PREMIX INFUSION
0.0000 mg/h | INTRAVENOUS | Status: DC
  Administered 2018-03-24: 2.5 mg/h via INTRAVENOUS
  Filled 2018-03-24: qty 100

## 2018-03-24 MED ORDER — ACETAMINOPHEN 325 MG PO TABS: 650.0000 mg | ORAL_TABLET | Freq: Four times a day (QID) | ORAL | Status: DC | PRN

## 2018-03-24 MED ORDER — ACETAMINOPHEN 650 MG RE SUPP: 650.0000 mg | Freq: Four times a day (QID) | RECTAL | Status: DC | PRN

## 2018-03-24 MED ORDER — AMIODARONE HCL IN DEXTROSE 360-4.14 MG/200ML-% IV SOLN: 60.0000 mg/h | INTRAVENOUS | Status: DC

## 2018-03-24 MED ORDER — GLYCOPYRROLATE 1 MG PO TABS
1.0000 mg | ORAL_TABLET | ORAL | Status: DC | PRN
  Filled 2018-03-24: qty 1

## 2018-04-09 NOTE — ED Notes (Signed)
Pt clenching teeth on tube, CCM with family at bedside. Pt having posturing type movement with arms and legs- decorticate.

## 2018-04-09 NOTE — ED Notes (Signed)
Family at bedside,  ?

## 2018-04-09 NOTE — Progress Notes (Signed)
RT placed oral airway due to pt biting down on tongue.

## 2018-04-09 NOTE — Progress Notes (Signed)
Pt extubated per withdrawal order.  RN and family at bedside.

## 2018-04-09 NOTE — Progress Notes (Signed)
Responded to ED nurse page to return to bedside to continue support. Patient is actively dying and is now comfort care.  Family is  having difficult time committing to withdrawing. Provided guidance, information sharing, empathetic listening, anticipatory grief support and Prayer. Chaplain available as needed.   Jaclynn Major, [place], [facility], Pager [phone] .

## 2018-04-09 NOTE — ED Triage Notes (Signed)
To ED via [facility] from home - CPR in progress on arrival- pulses present - compressions stopped-- EMS was called at [place], CPR started per ems at 0735- prior to EMS arrival, Fire dept shocked pt x 1.  Pt received Amiodarone 300mg  IV, 8 Epi's NS 1000cc, PTA  IO in right Tibia PTA

## 2018-04-09 NOTE — Consult Note (Addendum)
NAME:  [name] [name], MRN:  [id number], DOB:  [birth year], LOS: 0 ADMISSION DATE:  2018/04/04, CONSULTATION DATE:  04-04-2018  REFERRING MD:  Dr. Ralene Bathe, CHIEF COMPLAINT:  Cardiac arrest    Brief History   84 yo Male, admitted post cardiac arrest in the field.   History of present illness   84 yo male, PMH of ICM, prior EF 30%, HLD, HTN. Patient was found by his wife in the bed. Unresponsive around 6am. He was gurgling. She moved him to the floor. Started CPR. Called 911. EMS arrived ~5-10 mins later. The patient was coded in the home for an hour. They continued CPR until arrival in the ED with ROSC. In the ED his core temp was 86. LA 12, on Nepi. On vent and unable to maintain sats. He has roving eye movements, decorticate posturing, clenching teeth and tongue. Patient was given ativan by me during assessment dt to concern for seizure.   Of note, received LUCAS compression device for approximately 1 hour.   Past Medical History   Past Medical History:  Diagnosis Date  . Allergy   . BPH (benign prostatic hyperplasia)   . CAD (coronary artery disease)   . Cardiomyopathy   . GERD (gastroesophageal reflux disease)   . HYPERLIPIDEMIA   . Hypertension   . Myocardial infarction ([place]) 2004  . RESPIRATORY FAILURE   . Skin cancer    unknown if BCC or SCC  . Tremor   . UTI    Significant Hospital Events    Consults:  PCCM Cardiology   Procedures:  ETT  Significant Diagnostic Tests:   Micro Data:   Antimicrobials:   Interim history/subjective:  Please see HPI above   Objective   Blood pressure (!) 132/102, pulse 84, temperature (!) 95.7 F (35.4 C), resp. rate (!) 39, height 5' 7.5" (1.715 m), weight 65.3 kg, SpO2 (!) 83 %.    Vent Mode: PRVC FiO2 (%):  [100 %] 100 % Set Rate:  [15 bmp] 15 bmp Vt Set:  [500 mL] 500 mL PEEP:  [5 cmH20] 5 cmH20   Intake/Output Summary (Last 24 hours) at 04-04-18 1047 Last data filed at 04-04-18 1021 Gross per 24 hour  Intake 46.25  ml  Output -  Net 46.25 ml   Filed Weights   Apr 04, 2018 1053  Weight: 65.3 kg    Examination: General appearance: 84 y.o., male, critically ill, unresponsive Eyes: roving eye movements, pupils 1mm and sluggish, no nystagmus  HENT: NCAT; oropharynx, MMM Neck: Trachea midline; ETT inplace Lungs: crackles and rhonchi bilaterally  CV: RRR, s1 s2 Abdomen: Soft, non-tender; non-distended, BS present  Extremities: cold, cyanotic  Skin: Normal temperature, turgor and texture; no rash Psych: unable to assess  Neuro: no response to stimuli, decorticate posturing  Resolved Hospital Problem list    Assessment & Plan:   Cardiac arrest in the field, CPR with ROSC after 1hour  VR arrest  Acute pulmonary edema  Mulitple rib fractures Seizure, clinical diagnosis Likely severe anoxic brain injury  Cardiogenic shock  Lactic acidosis  Severe Acute hypoxemic respiratory failure  Intubated on MV   Plan:  Long discussion with family at the patients bedside in the ED. Multiple attempts at improving oxygenation at the bedside with vent  Sats maintained in the low 60s, on 100% 18 PEEP. Patients sats continued to fall Blood pressure continued to fall. Now with seizure like activity   His situation is not survivable.   This prognosis was discussed with the patients  family, wife and daughters at bedside.   The decision was made for palliative withdrawal of care here in the ED.   Withdrawal of life sustaining orders placed  Morphine and ativan given for comfort.  Best practice:  Diet: NPO Pain/Anxiety/Delirium protocol (if indicated): NA VAP protocol (if indicated): NA DVT prophylaxis: NA GI prophylaxis: NA Glucose control: NA Mobility: NA Code Status: DNR Family Communication: family at bedside  Disposition: COMFORT   Labs   CBC: Recent Labs  Lab 08-Apr-2018 0841 04-08-18 0847  WBC 12.2*  --   HGB 13.9 15.6  HCT 49.1 46.0  MCV 105.4*  --   PLT 192  --     Basic  Metabolic Panel: Recent Labs  Lab 04/08/2018 0845 2018/04/08 0847  NA 139 140  K 3.3* 3.7  CL 110 109  CO2 14*  --   GLUCOSE 269* 310*  BUN 20 32*  CREATININE 1.96* 1.50*  CALCIUM 8.2*  --   MG 2.6*  --    GFR: CrCl cannot be calculated (Unknown ideal weight.). Recent Labs  Lab 08-Apr-2018 0841 04/08/2018 0848  WBC 12.2*  --   LATICACIDVEN  --  11.52*    Liver Function Tests: Recent Labs  Lab 04/08/2018 0845  AST 204*  ALT 223*  ALKPHOS 92  BILITOT 1.0  PROT 5.4*  ALBUMIN 3.1*   No results for input(s): LIPASE, AMYLASE in the last 168 hours. No results for input(s): AMMONIA in the last 168 hours.  ABG    Component Value Date/Time   PHART 7.151 (LL) Apr 08, 2018 0904   PCO2ART 41.2 04/08/18 0904   PO2ART 54.0 (L) 04/08/18 0904   HCO3 15.9 (L) Apr 08, 2018 0904   TCO2 18 (L) 04-08-2018 0904   ACIDBASEDEF 15.0 (H) 2018-04-08 0904   O2SAT 90.0 April 08, 2018 0904     Coagulation Profile: Recent Labs  Lab 04/08/2018 0845  INR 1.45    Cardiac Enzymes: No results for input(s): CKTOTAL, CKMB, CKMBINDEX, TROPONINI in the last 168 hours.  HbA1C: Hgb A1c MFr Bld  Date/Time Value Ref Range Status  06/03/2017 06:16 AM 6.2 (H) 4.8 - 5.6 % Final    Comment:    (NOTE) Pre diabetes:          5.7%-6.4% Diabetes:              >6.4% Glycemic control for   <7.0% adults with diabetes   04/16/2008 12:00 AM 6.3 (H) 4.6 - 6.0 % Final    Comment:    See lab report for associated comment(s)    CBG: No results for input(s): GLUCAP in the last 168 hours.  Review of Systems:   Unable to be obtained due to critical illness. Intubated, unresponsive.  Past Medical History  He,  has a past medical history of Allergy, BPH (benign prostatic hyperplasia), CAD (coronary artery disease), Cardiomyopathy, GERD (gastroesophageal reflux disease), HYPERLIPIDEMIA, Hypertension, Myocardial infarction ([place]) (2004), RESPIRATORY FAILURE, Skin cancer, Tremor, and UTI.   Surgical History    Past  Surgical History:  Procedure Laterality Date  . CARDIAC CATHETERIZATION  04-2003   stents  . CATARACT EXTRACTION Bilateral   . TONSILLECTOMY       Social History   reports that he quit smoking about 37 years ago. His smoking use included cigarettes. He has a 30.00 pack-year smoking history. He has never used smokeless tobacco. He reports that he does not drink alcohol or use drugs.   Family History   His family history includes Coronary artery disease in his  father; Heart attack in his father; Hypertension in his father; Skin cancer in his father.   Allergies Allergies  Allergen Reactions  . Sulfonamide Derivatives Other (See Comments)    REACTION: history     Home Medications  Prior to Admission medications   Medication Sig Start Date End Date Taking? Authorizing Provider  Alpha-D-Galactosidase Satira Mccallum) TABS Take 3 tablets by mouth 2 (two) times daily as needed (gas).   Yes [provider]  atorvastatin (LIPITOR) 80 MG tablet TAKE 1 TABLET BY MOUTH EVERY DAY Patient taking differently: Take 80 mg by mouth daily.  11/16/17  Yes Nafziger, [name], NP  carvedilol (COREG) 12.5 MG tablet TAKE 1 TABLET (12.5 MG TOTAL) BY MOUTH 2 (TWO) TIMES DAILY WITH A MEAL. Patient taking differently: Take 12.5 mg by mouth 2 (two) times daily with a meal.  11/16/17  Yes Nafziger, [name], NP  clopidogrel (PLAVIX) 75 MG tablet Take 1 tablet (75 mg total) by mouth daily. 06/09/17 06/09/18 Yes Nafziger, [name], NP  lisinopril (PRINIVIL,ZESTRIL) 2.5 MG tablet TAKE 1 TABLET TWICE A DAY AT 10 AM AND 5 PM Patient taking differently: Take 2.5 mg by mouth daily.  11/25/17  Yes Nafziger, [name], NP  nitroGLYCERIN (NITROSTAT) 0.4 MG SL tablet Place 1 tablet (0.4 mg total) under the tongue every 5 (five) minutes as needed. Patient taking differently: Place 0.4 mg under the tongue every 5 (five) minutes as needed for chest pain.  08/29/13  Yes Dorena Cookey, MD  NON FORMULARY HEMP OIL   Yes [provider]    Probiotic Product (PROBIOTIC PO) Take 1 tablet by mouth daily.    Yes [provider]  ranitidine (ZANTAC) 150 MG capsule TAKE ONE CAPSULE BY MOUTH EVERY EVENING Patient taking differently: Take 150 mg by mouth every evening.  11/15/14  Yes Dorena Cookey, MD     This patient is critically ill with multiple organ system failure; which, requires frequent high complexity decision making, assessment, support, evaluation, and titration of therapies. This was completed through the application of advanced monitoring technologies and extensive interpretation of multiple databases. This time was dedicated to active management at the bedside in the ED. Extensive conversation with the patients family at bedside. As well as repeat conversations in the family consult room. I discussed care with Dr.Skains from cardiology. The final decision was to make the patient comfortable. During this encounter critical care time was devoted to patient care services described in this note for 90 minutes.   Garner Nash, DO [facility] 21-Apr-2018 10:47 AM  Personal pager: [phone] If unanswered, please page CCM On-call: [phone]

## 2018-04-09 NOTE — ED Provider Notes (Signed)
[facility] EMERGENCY DEPARTMENT Provider Note   CSN: [id number] Arrival date & time: 2018/04/13  9371     History   Chief Complaint Chief Complaint  Patient presents with  . Cardiac Arrest  . post CPR    HPI [name] [name] is a 84 y.o. male.  The history is provided by the EMS personnel, medical records and the spouse. No language interpreter was used.   [name] [name] is a 84 y.o. male who presents to the Emergency Department complaining of cardiac arrest. Level V caveat due to unresponsiveness. Per EMS report he developed shortness of breath at home and his wife found him unresponsive and called 911 at [phone]. She started CPR and on fire arrival he was found to be in V. fib arrest and received defibrillation. He had 4 to 5 shocks in total for V. fib followed by ventricular tachycardia. He then had PEA activity. He was given amio bolus of 300 mg. CPR was continued. He was intubated. He had an IO placed in the right anterior tibia. He has a history of prior V. fib arrest and coronary artery disease.  CPR was continued until ED arrival.     Past Medical History:  Diagnosis Date  . Allergy   . BPH (benign prostatic hyperplasia)   . CAD (coronary artery disease)   . Cardiomyopathy   . GERD (gastroesophageal reflux disease)   . HYPERLIPIDEMIA   . Hypertension   . Myocardial infarction ([place]) 2004  . RESPIRATORY FAILURE   . Skin cancer    unknown if BCC or SCC  . Tremor   . UTI     Patient Active Problem List   Diagnosis Date Noted  . Altered mental status 06/02/2017  . Aphasia 06/02/2017  . CAD (coronary artery disease) 06/02/2017  . Cardiomyopathy ([place]) 06/02/2017  . BPH (benign prostatic hyperplasia) 06/02/2017  . Benign essential tremor 06/02/2017  . CKD (chronic kidney disease) stage 3, GFR 30-59 ml/min (HCC) 06/02/2017  . Hyperlipidemia 04/16/2008  . Essential hypertension 04/16/2008  . Coronary atherosclerosis 04/16/2008  . GERD 04/16/2008     Past Surgical History:  Procedure Laterality Date  . CARDIAC CATHETERIZATION  04-2003   stents  . CATARACT EXTRACTION Bilateral   . TONSILLECTOMY          Home Medications    Prior to Admission medications   Medication Sig Start Date End Date Taking? Authorizing Provider  Alpha-D-Galactosidase Satira Mccallum) TABS Take 3 tablets by mouth 2 (two) times daily as needed (gas).   Yes [provider]  atorvastatin (LIPITOR) 80 MG tablet TAKE 1 TABLET BY MOUTH EVERY DAY Patient taking differently: Take 80 mg by mouth daily.  11/16/17  Yes Nafziger, [name], NP  carvedilol (COREG) 12.5 MG tablet TAKE 1 TABLET (12.5 MG TOTAL) BY MOUTH 2 (TWO) TIMES DAILY WITH A MEAL. Patient taking differently: Take 12.5 mg by mouth 2 (two) times daily with a meal.  11/16/17  Yes Nafziger, [name], NP  clopidogrel (PLAVIX) 75 MG tablet Take 1 tablet (75 mg total) by mouth daily. 06/09/17 06/09/18 Yes Nafziger, [name], NP  lisinopril (PRINIVIL,ZESTRIL) 2.5 MG tablet TAKE 1 TABLET TWICE A DAY AT 10 AM AND 5 PM Patient taking differently: Take 2.5 mg by mouth daily.  11/25/17  Yes Nafziger, [name], NP  nitroGLYCERIN (NITROSTAT) 0.4 MG SL tablet Place 1 tablet (0.4 mg total) under the tongue every 5 (five) minutes as needed. Patient taking differently: Place 0.4 mg under the tongue every 5 (five)  minutes as needed for chest pain.  08/29/13  Yes Dorena Cookey, MD  NON FORMULARY HEMP OIL   Yes [provider]  Probiotic Product (PROBIOTIC PO) Take 1 tablet by mouth daily.    Yes [provider]  ranitidine (ZANTAC) 150 MG capsule TAKE ONE CAPSULE BY MOUTH EVERY EVENING Patient taking differently: Take 150 mg by mouth every evening.  11/15/14  Yes Dorena Cookey, MD    Family History Family History  Problem Relation Age of Onset  . Coronary artery disease Father   . Hypertension Father   . Heart attack Father   . Skin cancer Father     Social History Social History   Tobacco Use  . Smoking  status: Former Smoker    Packs/day: 1.00    Years: 30.00    Pack years: 30.00    Types: Cigarettes    Last attempt to quit: 07/27/1980    Years since quitting: 37.6  . Smokeless tobacco: Never Used  Substance Use Topics  . Alcohol use: No    Alcohol/week: 0.0 standard drinks  . Drug use: No     Allergies   Sulfonamide derivatives   Review of Systems Review of Systems  Unable to perform ROS: Patient unresponsive     Physical Exam Updated Vital Signs BP (!) 132/102   Pulse (!) 0   Temp (!) 97 F (36.1 C)   Resp (!) 0   Ht 5' 7.5" (1.715 m)   Wt 65.3 kg   SpO2 (!) 49%   BMI 22.21 kg/m   Physical Exam  Constitutional: He appears well-developed.  Ill appearing  HENT:  Head: Normocephalic.  Pupils midsized and reactive bilaterally. 7-0 endotracheal tube in place with small amount of blood in the mouth.  Cardiovascular: Normal rate and regular rhythm.  No murmur heard. Pulmonary/Chest:  Course of breath sounds bilaterally. Spontaneous respirations at about 30.  Abdominal: Soft. There is no tenderness. There is no rebound and no guarding.  Musculoskeletal: He exhibits no edema or tenderness.  Right anterior tibia with I/O in place  Neurological:  GCS 1-1-1  Skin: There is pallor.  Cool, dry skin.  Diffuse mottling with delayed cap refill.    Psychiatric:  Unable to assess  Nursing note and vitals reviewed.    ED Treatments / Results  Labs (all labs ordered are listed, but only abnormal results are displayed) Labs Reviewed  CBC - Abnormal; Notable for the following components:      Result Value   WBC 12.2 (*)    MCV 105.4 (*)    MCHC 28.3 (*)    nRBC 1.3 (*)    All other components within normal limits  PROTIME-INR - Abnormal; Notable for the following components:   Prothrombin Time 17.5 (*)    All other components within normal limits  COMPREHENSIVE METABOLIC PANEL - Abnormal; Notable for the following components:   Potassium 3.3 (*)    CO2 14 (*)     Glucose, Bld 269 (*)    Creatinine, Ser 1.96 (*)    Calcium 8.2 (*)    Total Protein 5.4 (*)    Albumin 3.1 (*)    AST 204 (*)    ALT 223 (*)    GFR calc non Af Amer 30 (*)    GFR calc Af Amer 34 (*)    All other components within normal limits  MAGNESIUM - Abnormal; Notable for the following components:   Magnesium 2.6 (*)    All  other components within normal limits  I-STAT CHEM 8, ED - Abnormal; Notable for the following components:   BUN 32 (*)    Creatinine, Ser 1.50 (*)    Glucose, Bld 310 (*)    Calcium, Ion 1.09 (*)    TCO2 18 (*)    All other components within normal limits  I-STAT TROPONIN, ED - Abnormal; Notable for the following components:   Troponin i, poc 0.47 (*)    All other components within normal limits  I-STAT CG4 LACTIC ACID, ED - Abnormal; Notable for the following components:   Lactic Acid, Venous 11.52 (*)    All other components within normal limits  I-STAT VENOUS BLOOD GAS, ED - Abnormal; Notable for the following components:   pH, Ven 6.973 (*)    pCO2, Ven 92.7 (*)    pO2, Ven 30.0 (*)    Acid-base deficit 13.0 (*)    All other components within normal limits  I-STAT ARTERIAL BLOOD GAS, ED - Abnormal; Notable for the following components:   pH, Arterial 7.151 (*)    pO2, Arterial 54.0 (*)    Bicarbonate 15.9 (*)    TCO2 18 (*)    Acid-base deficit 15.0 (*)    All other components within normal limits  I-STAT CG4 LACTIC ACID, ED - Abnormal; Notable for the following components:   Lactic Acid, Venous 8.73 (*)    All other components within normal limits    EKG EKG Interpretation  Date/Time:  03/29/2018 08:39:53 EST Ventricular Rate:  85 PR Interval:    QRS Duration: 168 QT Interval:  462 QTC Calculation: 550 R Axis:   -98 Text Interpretation:  Sinus rhythm Right bundle branch block Confirmed by Quintella Reichert [phone]) on 03/29/2018 8:51:59 AM   Radiology Dg Chest Portable 1 View  Result Date: 29-Mar-2018 CLINICAL  DATA:  Status post cardiopulmonary resuscitation EXAM: PORTABLE CHEST 1 VIEW COMPARISON:  None. FINDINGS: Endotracheal tube tip is at the origin of the right main bronchus. No pneumothorax. There is perihilar alveolar opacity bilaterally, likely edema. There is mild interstitial edema as well. Heart is upper normal in size with pulmonary venous hypertension. No bone lesions. IMPRESSION: Endotracheal tube tip is at the origin of the right main bronchus. Advise withdrawing endotracheal tube approximately 4 cm. No pneumothorax. Perihilar alveolar opacity, likely edema. A degree of superimposed pneumonia, particularly in the right upper lobe medially, cannot be excluded. Mild interstitial edema. Underlying pulmonary venous hypertension. Critical Value/emergent results were called by telephone at the time of interpretation on Mar 29, 2018 at 9:16 am to [facility] , who verbally acknowledged these results. Electronically Signed   By: Lowella Grip III M.D.   On: 29-Mar-2018 09:17    Procedures Procedures (including critical care time) CRITICAL CARE Performed by: Quintella Reichert   Total critical care time: 45 minutes  Critical care time was exclusive of separately billable procedures and treating other patients.  Critical care was necessary to treat or prevent imminent or life-threatening deterioration.  Critical care was time spent personally by me on the following activities: development of treatment plan with patient and/or surrogate as well as nursing, discussions with consultants, evaluation of patient's response to treatment, examination of patient, obtaining history from patient or surrogate, ordering and performing treatments and interventions, ordering and review of laboratory studies, ordering and review of radiographic studies, pulse oximetry and re-evaluation of patient's condition.  Venipuncture performed in the right femoral vein for rapid access of blood. The area was prepped  with  chlorhexidine. 18gauge needle was used to return 10 mL of blood. Direct pressure applied after needle removal. Patient tolerated procedure well. Medications Ordered in ED Medications  morphine 4 MG/ML injection 4 mg (4 mg Intravenous Given Apr 13, 2018 1133)  LORazepam (ATIVAN) injection 4 mg (has no administration in time range)  dextrose 5 % solution (has no administration in time range)  acetaminophen (TYLENOL) tablet 650 mg (has no administration in time range)    Or  acetaminophen (TYLENOL) suppository 650 mg (has no administration in time range)  diphenhydrAMINE (BENADRYL) injection 25 mg (has no administration in time range)  glycopyrrolate (ROBINUL) tablet 1 mg ( Oral See Alternative 2018/04/13 1151)    Or  glycopyrrolate (ROBINUL) injection 0.2 mg ( Subcutaneous See Alternative 04/13/2018 1151)    Or  glycopyrrolate (ROBINUL) injection 0.2 mg (0.2 mg Intravenous Not Given 13-Apr-2018 1151)  polyvinyl alcohol (LIQUIFILM TEARS) 1.4 % ophthalmic solution 1 drop (has no administration in time range)  morphine 2 MG/ML injection 2 mg (has no administration in time range)  morphine bolus via infusion 5 mg (has no administration in time range)  morphine 100mg  in NS 142mL (1mg /mL) infusion - premix (2.5 mg/hr Intravenous New Bag/Given 04-13-18 1143)  sodium bicarbonate injection (100 mEq Intravenous Given April 13, 2018 0917)  norepinephrine (LEVOPHED) 4-5 MG/250ML-% infusion SOLN (  Stopped 2018/04/13 1021)  LORazepam (ATIVAN) injection 4 mg (4 mg Intravenous Given Apr 13, 2018 1015)  morphine 4 MG/ML injection (4 mg  Given 13-Apr-2018 1110)     Initial Impression / Assessment and Plan / ED Course  I have reviewed the triage vital signs and the nursing notes.  Pertinent labs & imaging results that were available during my care of the patient were reviewed by me and considered in my medical decision making (see chart for details).     Patient brought to the emergency department following V. fib AV attack  arrest. On ED arrival initial pulse check with week femoral pulses bilaterally, sinus rhythm on the monitor. Endotracheal tube was retracted. Amiodarone fusion started and he was placed on an IV drip for mild hypertension. He was given bicarb for acidosis. Cardiology and critical care consulted. Patient's family was updated of critical nature of illness. On initial evaluation wife states that patient would want all resuscitative measures taken and is full code.  Critical care evaluated the patient in the emergency department discussed with family poor prognosis given prolonged downtime and underlying illness. He was transitioned to comfort care and extubated and expired and shortly thereafter in the emergency department.  Final Clinical Impressions(s) / ED Diagnoses   Final diagnoses:  Cardiac arrest [facility])    ED Discharge Orders    None       Quintella Reichert, MD 2018-04-13 1708

## 2018-04-09 NOTE — ED Notes (Signed)
Paged chaplain for family

## 2018-04-09 NOTE — Progress Notes (Signed)
Critical ABG values given to Dr. Ralene Bathe.

## 2018-04-09 NOTE — Progress Notes (Signed)
   April 22, 2018 0900  Clinical Encounter Type  Visited With Health care provider;Family  Visit Type Critical Care;ED;Other (Comment) (CPR)  Referral From Other (Comment) (ED)  Consult/Referral To Chaplain  Spiritual Encounters  Spiritual Needs Prayer;Emotional  Stress Factors  Patient Stress Factors Major life changes;Health changes  Family Stress Factors Loss of control;Exhausted   Responded to call from ED for CPR in progress.  Met w/ pt's wife, 2 daughters, and son-in-law in consult rm.  Present when ed dr and cardiologist each spoke w/ family.  Prayed w/ family.  Passed off to [employer].  Myra Gianotti resident, [phone]

## 2018-04-09 NOTE — Consult Note (Signed)
Cardiology Consultation:   Patient ID: [name] [name] MRN: [id number]; DOB: Sep 21, 1931  Admit date: 03/31/18 Date of Consult: 03/31/2018  Primary Care Provider: Dorothyann Peng, NP Primary Cardiologist: Kirk Ruths, MD  Primary Electrophysiologist:  None    Patient Profile:   [name] [name] is a 84 y.o. male with a hx of ischemic cardia myopathy who is being seen today for the evaluation of cardiac arrest at the request of Dr Ralene Bathe.  History of Present Illness:   [name] [name] is an 84 year old gentleman with known ischemic cardiomyopathy EF 30%, previously did not wish to pursue defibrillator with prior myocardial infarction in December 2004 comp located by V. fib arrest PCI of LAD at that time.  March Myoview in 2013 showed EF of 30%.  Anterior and apical infarct.  No ischemia.  Last echo in January 2019 showed severely reduced LV function.  At the time of his last clinic visit in April 2019 he was denying any chest pain dyspnea or palpitations.  Earlier this morning, his wife noticed that he was having what sounds like agonal breathing then stopped breathing.  She does not recall that he had any febrile illness or symptoms prior to this event.  At that moment she called 911, placed him on the floor and started CPR.  When EMS arrived, he was in ventricular fibrillation and received 6 total defibrillations, multiple rounds of epinephrine and was intubated successfully.  Amiodarone was also utilized.  After about an hour of CPR, he did have return of spontaneous circulation.  In the field, he was seen to have increased breathing rate over the ventilator.  Here in the emergency department, we will call to evaluate.  pH was 6.9 on arrival PCO2 of 92 venous.  Lactic acid was 11.5.  Arterial was 7.1/40 1/54.  He had a generalized mottled appearance that seem to improve somewhat during the initial minutes in the emergency room after initiation of norepinephrine and increase in blood  pressure.  I was able to have lengthy family discussion.  Also discussion with critical care team as well.  See below.  Past Medical History:  Diagnosis Date  . Allergy   . BPH (benign prostatic hyperplasia)   . CAD (coronary artery disease)   . Cardiomyopathy   . GERD (gastroesophageal reflux disease)   . HYPERLIPIDEMIA   . Hypertension   . Myocardial infarction ([place]) 2004  . RESPIRATORY FAILURE   . Skin cancer    unknown if BCC or SCC  . Tremor   . UTI     Past Surgical History:  Procedure Laterality Date  . CARDIAC CATHETERIZATION  04-2003   stents  . CATARACT EXTRACTION Bilateral   . TONSILLECTOMY       Home Medications:  Prior to Admission medications   Medication Sig Start Date End Date Taking? Authorizing Provider  Alpha-D-Galactosidase Satira Mccallum) TABS Take 3 tablets by mouth 2 (two) times daily as needed (gas).   Yes [provider]  atorvastatin (LIPITOR) 80 MG tablet TAKE 1 TABLET BY MOUTH EVERY DAY Patient taking differently: Take 80 mg by mouth daily.  11/16/17  Yes Nafziger, [name], NP  carvedilol (COREG) 12.5 MG tablet TAKE 1 TABLET (12.5 MG TOTAL) BY MOUTH 2 (TWO) TIMES DAILY WITH A MEAL. Patient taking differently: Take 12.5 mg by mouth 2 (two) times daily with a meal.  11/16/17  Yes Nafziger, [name], NP  clopidogrel (PLAVIX) 75 MG tablet Take 1 tablet (75 mg total) by mouth daily. 06/09/17 06/09/18 Yes Nafziger,  [name], NP  lisinopril (PRINIVIL,ZESTRIL) 2.5 MG tablet TAKE 1 TABLET TWICE A DAY AT 10 AM AND 5 PM Patient taking differently: Take 2.5 mg by mouth daily.  11/25/17  Yes Nafziger, [name], NP  nitroGLYCERIN (NITROSTAT) 0.4 MG SL tablet Place 1 tablet (0.4 mg total) under the tongue every 5 (five) minutes as needed. Patient taking differently: Place 0.4 mg under the tongue every 5 (five) minutes as needed for chest pain.  08/29/13  Yes Dorena Cookey, MD  NON FORMULARY HEMP OIL   Yes [provider]  Probiotic Product (PROBIOTIC PO) Take 1 tablet  by mouth daily.    Yes [provider]  ranitidine (ZANTAC) 150 MG capsule TAKE ONE CAPSULE BY MOUTH EVERY EVENING Patient taking differently: Take 150 mg by mouth every evening.  11/15/14  Yes Dorena Cookey, MD    Inpatient Medications: Scheduled Meds:  Continuous Infusions: . amiodarone    . amiodarone    . epinephrine Stopped (2018-04-15 0920)   PRN Meds: sodium bicarbonate  Allergies:    Allergies  Allergen Reactions  . Sulfonamide Derivatives     REACTION: history    Social History:   Social History   Socioeconomic History  . Marital status: Married    Spouse name: Not on file  . Number of children: Not on file  . Years of education: Not on file  . Highest education level: Not on file  Occupational History  . Not on file  Social Needs  . Financial resource strain: Not on file  . Food insecurity:    Worry: Not on file    Inability: Not on file  . Transportation needs:    Medical: Not on file    Non-medical: Not on file  Tobacco Use  . Smoking status: Former Smoker    Packs/day: 1.00    Years: 30.00    Pack years: 30.00    Types: Cigarettes    Last attempt to quit: 07/27/1980    Years since quitting: 37.6  . Smokeless tobacco: Never Used  Substance and Sexual Activity  . Alcohol use: No    Alcohol/week: 0.0 standard drinks  . Drug use: No  . Sexual activity: Never    Birth control/protection: None  Lifestyle  . Physical activity:    Days per week: Not on file    Minutes per session: Not on file  . Stress: Not on file  Relationships  . Social connections:    Talks on phone: Not on file    Gets together: Not on file    Attends religious service: Not on file    Active member of club or organization: Not on file    Attends meetings of clubs or organizations: Not on file    Relationship status: Not on file  . Intimate partner violence:    Fear of current or ex partner: Not on file    Emotionally abused: Not on file    Physically abused: Not  on file    Forced sexual activity: Not on file  Other Topics Concern  . Not on file  Social History Narrative   Retired from working in Office manager   Married for 58 years    He likes to walk and read       Family History:    Family History  Problem Relation Age of Onset  . Coronary artery disease Father   . Hypertension Father   . Heart attack Father   . Skin cancer Father  ROS:  Please see the history of present illness.  Unable but wife says that he did not have any preceding symptoms prior to his agonal breathing.   Physical Exam/Data:   Vitals:   04-18-2018 0930 04/18/2018 0935 18-Apr-2018 0940 04/18/2018 0945  BP: 104/71 90/71 111/78 120/79  Pulse: 71 68 72 70  Resp: (!) 37 (!) 35 (!) 35 (!) 31  Temp:      TempSrc:      SpO2: 90% (!) 88% (!) 86% (!) 87%    Intake/Output Summary (Last 24 hours) at Apr 18, 2018 0957 Last data filed at April 18, 2018 0920 Gross per 24 hour  Intake 6.87 ml  Output -  Net 6.87 ml   There were no vitals filed for this visit. There is no height or weight on file to calculate BMI.  General: Gravely ill, mottled appearance, on ventilator HEENT: No significant JVD appreciated Lymph: no adenopathy Neck: no JVD Endocrine:  No thryomegaly Vascular: No carotid bruits; difficult to palpate distal pulses  cardiac:  normal S1, S2; RRR; no murmur, occasional ectopy Lungs: Vent noise  abd: soft, nontender, no hepatomegaly  Ext: no edema, intraosseous port noted right lower extremity Musculoskeletal:  No deformities, BUE and BLE strength normal and equal Skin: Cool, mottled, gray hue Neuro: Pupillary reflex noted, spontaneous breathing over the ventilator noted Psych: Unable  EKG:  The EKG was personally reviewed and demonstrates: Sinus rhythm, occasional PAC with right bundle branch block appearance, interventricular conduction delay.  When compared to prior EKG, QRS complex is wider than prior.  Telemetry:  Telemetry was personally  reviewed and demonstrates: Occasional ectopy noted.  No V. tach  Relevant CV Studies:  Echocardiogram 06/03/2017:  - Left ventricle: The cavity size was at the upper limits of   normal. Wall thickness was normal. Systolic function was severely   reduced. The estimated ejection fraction was in the range of 25%   to 30%. Akinesis and aneurysmal deformity of the   mid-apicalanteroseptal, anterior, and apical myocardium;   consistent with infarction in the distribution of the left   anterior descending coronary artery. Doppler parameters are   consistent with abnormal left ventricular relaxation (grade 1   diastolic dysfunction). No evidence of thrombus. Acoustic   contrast opacification revealed no evidence ofthrombus. - Mitral valve: Calcified annulus. - Left atrium: The atrium was mildly dilated.  Impressions:  - There is little change from the report from 2005. The images are   not available for direct comparison.  Laboratory Data:  Chemistry Recent Labs  Lab 04/18/18 0845 Apr 18, 2018 0847  NA 139 140  K 3.3* 3.7  CL 110 109  CO2 14*  --   GLUCOSE 269* 310*  BUN 20 32*  CREATININE 1.96* 1.50*  CALCIUM 8.2*  --   GFRNONAA 30*  --   GFRAA 34*  --   ANIONGAP 15  --     Recent Labs  Lab Apr 18, 2018 0845  PROT 5.4*  ALBUMIN 3.1*  AST 204*  ALT 223*  ALKPHOS 92  BILITOT 1.0   Hematology Recent Labs  Lab 04-18-2018 0841 18-Apr-2018 0847  WBC 12.2*  --   RBC 4.66  --   HGB 13.9 15.6  HCT 49.1 46.0  MCV 105.4*  --   MCH 29.8  --   MCHC 28.3*  --   RDW 13.8  --   PLT 192  --    Cardiac EnzymesNo results for input(s): TROPONINI in the last 168 hours.  Recent Labs  Lab 04/18/18  0845  TROPIPOC 0.47*    BNPNo results for input(s): BNP, PROBNP in the last 168 hours.  DDimer No results for input(s): DDIMER in the last 168 hours.  Radiology/Studies:  Dg Chest Portable 1 View  Result Date: 04-16-18 CLINICAL DATA:  Status post cardiopulmonary resuscitation EXAM:  PORTABLE CHEST 1 VIEW COMPARISON:  None. FINDINGS: Endotracheal tube tip is at the origin of the right main bronchus. No pneumothorax. There is perihilar alveolar opacity bilaterally, likely edema. There is mild interstitial edema as well. Heart is upper normal in size with pulmonary venous hypertension. No bone lesions. IMPRESSION: Endotracheal tube tip is at the origin of the right main bronchus. Advise withdrawing endotracheal tube approximately 4 cm. No pneumothorax. Perihilar alveolar opacity, likely edema. A degree of superimposed pneumonia, particularly in the right upper lobe medially, cannot be excluded. Mild interstitial edema. Underlying pulmonary venous hypertension. Critical Value/emergent results were called by telephone at the time of interpretation on April 16, 2018 at 9:16 am to [facility] , who verbally acknowledged these results. Electronically Signed   By: Lowella Grip III M.D.   On: 04/16/2018 09:17    Assessment and Plan:   Ventricular fibrillatory arrest/cardiac arrest/cardiogenic shock in the setting of ischemic cardiomyopathy EF 30% - Prolonged CPR up to 1 hour, hypoperfusion, lactic acid level of approximately 11, pH originally 6.9, mottled skin appearance on arrival however did have a few brainstem reflexes such as pupillary reflex and shallow breathing over the vent.  Had lengthy family discussion and they understand the gravity of the situation.  I personally think his wife for starting CPR when she did.  They clearly stated to me that they would want full support.  I also relayed to them that this would be an hour by our process and if we as a care team felt as though his current situation was nonsustainable with life, we would help him through the process of comfort care.  At this time, he is currently on norepinephrine.  Appreciate discussion as well with critical care team.  Critical care time 1 hour spent with patient data review family discussion, discussion with  critical care, and this gentleman with known ischemic cardiomyopathy and V. fib arrest with multisystem organ failure.        For questions or updates, please contact [place] Please consult [email or web address] for contact info under     Signed, Candee Furbish, MD  2018-04-16 9:57 AM

## 2018-04-09 NOTE — ED Notes (Signed)
Pt is without respirations, no audible heartbeat- family remains at bedside.

## 2018-04-09 DEATH — deceased

## 2020-01-01 IMAGING — CT CT ANGIO NECK
2 of 12 series · 6 of 36 positions shown · IV contrast (iopamidol)
Comparison: CT head without contrast 05/18/2003

CLINICAL DATA: 3 minutes episode of aphasia this morning at
approximately [DATE] a.m.. Arterial stricture/occlusion of the head
or neck.

EXAM:
CT ANGIOGRAPHY HEAD AND NECK
TECHNIQUE: Multidetector CT imaging of the head and neck was performed using
the standard protocol during bolus administration of intravenous
contrast. Multiplanar CT image reconstructions and MIPs were
obtained to evaluate the vascular anatomy. Carotid stenosis
measurements (when applicable) are obtained utilizing NASCET
criteria, using the distal internal carotid diameter as the
denominator.
CONTRAST:  50mL ZR4BM1-8TG IOPAMIDOL (ZR4BM1-8TG) INJECTION 76%

[Series 7: ax thins · axial · 0.39mm/px · z∈[-294,-70]mm · 5 of 337 slices shown]
[im 57/337  soft-tissue]
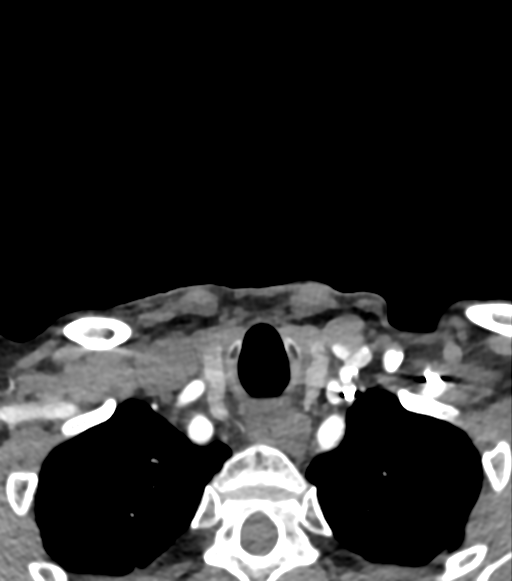
[im 113/337  bone]
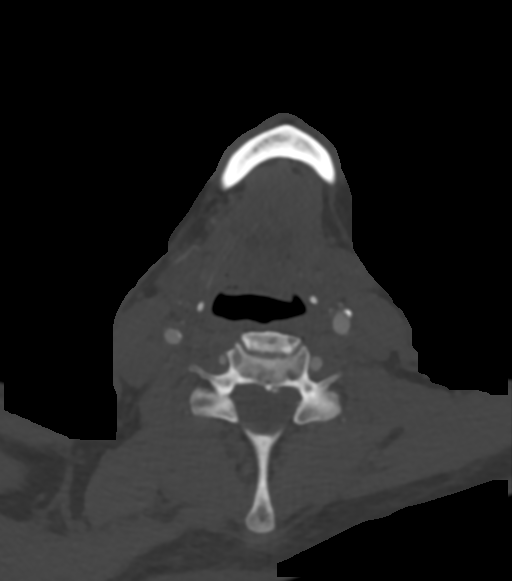
[im 169/337  soft-tissue]
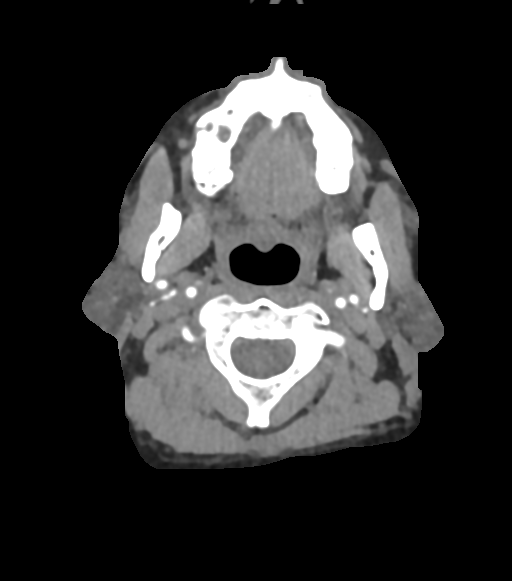
[im 225/337  bone]
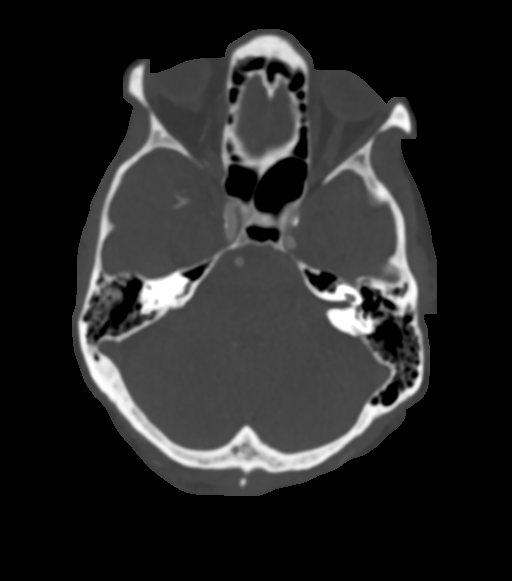
[im 281/337  soft-tissue]
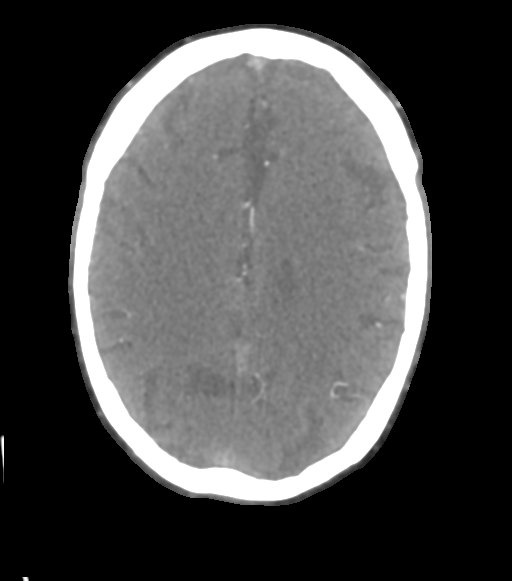

[Series 16: sag soft · sagittal · 0.34mm/px · 1 of 67 slices shown]
[im 22/67  soft-tissue]
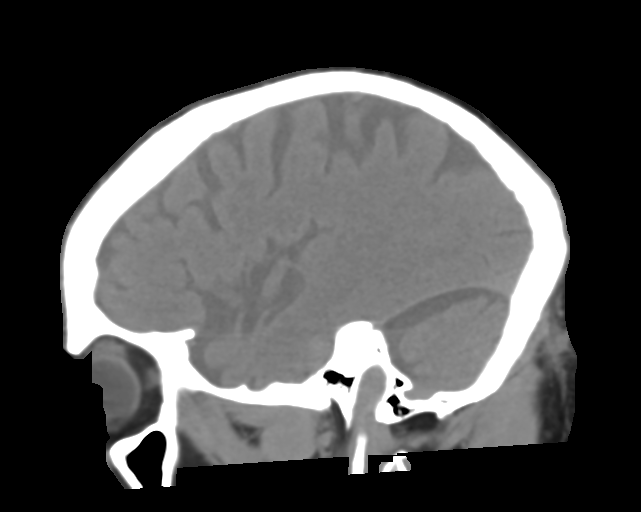

[6 of 36 positions shown; findings below may reference images not displayed]

FINDINGS: CT HEAD FINDINGS

Brain: Mild atrophy and white matter changes are within normal
limits for age. No acute infarct, hemorrhage, or mass lesion is
present. The ventricles are of normal size. No significant
extraaxial fluid collection is present.

The brainstem and cerebellum are within normal limits bilaterally.

Vascular: Atherosclerotic calcifications are present in the
cavernous internal carotid arteries bilaterally. Calcifications are
present at the dural margin of the vertebral arteries bilaterally.
There is no hyperdense vessel.

Skull: Acute the calvarium is intact. No focal lytic or blastic
lesions are present.

Sinuses: Mild mucosal thickening is present in the maxillary sinuses
bilaterally. Wall thickening of the right maxillary sinus appears
chronic.

Orbits: Bilateral lens replacements are present. Globes and orbits
are within normal limits.

Review of the MIP images confirms the above findings

CTA NECK FINDINGS

Aortic arch: A 3 vessel arch configuration is present. Vascular
calcifications are present at the aortic arch without aneurysm.
There is no significant stenosis of the great vessel origins.

Right carotid system: The right common carotid artery is tortuous.
Focal calcification is present just before the bifurcation. There is
no significant stenosis in the cervical right ICA. The bifurcation
is otherwise normal. The cervical right ICA is within normal limits.

Left carotid system: The left common carotid and carotid artery is
within normal limits. Atherosclerotic disease is present in the
proximal left ICA without a significant stenosis relative to the
more distal vessel.

Vertebral arteries: Calcifications are present at the origin of the
dominant left vertebral artery without a significant stenosis. The
right vertebral artery origin is within normal limits at the right
subclavian artery the left vertebral artery is dominant in the neck.
There is no focal stenosis to either vertebral artery.

Skeleton: There is chronic loss of disc height throughout the
cervical spine. Uncovertebral and foraminal narrowing is most
evident at C3-4 and C5-6. No focal lytic or blastic lesions are
present.

Other neck: No focal mucosal or submucosal lesions are present.
Salivary glands are within normal limits. There is no significant
adenopathy.

Upper chest: Mild diffuse ground-glass attenuation likely represents
atelectasis or edema. There is no focal nodule or mass lesion.

Review of the MIP images confirms the above findings

CTA HEAD FINDINGS

Anterior circulation: Atherosclerotic changes are present within the
cavernous internal carotid arteries bilaterally without a
significant stenosis relative to the more distal vessel. The A1 and
M1 segments are normal. The anterior communicating artery is patent.
The MCA bifurcation is intact. ACA and MCA branch vessels are within
normal limits.

Posterior circulation: Left vertebral artery is the dominant vessel.
PICA origins are visualized and normal. Both posterior cerebral
arteries originate from the basilar tip. There is asymmetric
attenuation of distal right PCA branch vessels. Significant proximal
stenosis or occlusion is present.

Venous sinuses: The dural sinuses are patent. The straight sinus and
deep cerebral veins are intact. Cortical veins are unremarkable.

Anatomic variants: None

Delayed phase: The postcontrast images demonstrate no pathologic
enhancement.

Review of the MIP images confirms the above findings
IMPRESSION: 1. Atherosclerotic changes involving the carotid bifurcations,
cavernous internal carotid arteries, and aortic arch without
significant stenosis.
2. Asymmetric attenuation of distal right PCA branch vessels without
a significant proximal stenosis or occlusion.
3. Mild diffuse distal MCA branch vessel disease as well.
4. Multilevel spondylosis of the cervical spine.
5. Diffuse ground-glass attenuation of the lungs suggesting edema or
atelectasis.
